# Patient Record
Sex: Female | Born: 1955 | Race: White | Hispanic: No | Marital: Married | Smoking: Former smoker
Health system: Southern US, Community
[De-identification: ages and names within clinical notes are randomized; demographics above are authoritative.]

## PROBLEM LIST (undated history)

## (undated) DIAGNOSIS — R0600 Dyspnea, unspecified: Secondary | ICD-10-CM

## (undated) DIAGNOSIS — M1990 Unspecified osteoarthritis, unspecified site: Secondary | ICD-10-CM

## (undated) DIAGNOSIS — J449 Chronic obstructive pulmonary disease, unspecified: Secondary | ICD-10-CM

## (undated) DIAGNOSIS — C801 Malignant (primary) neoplasm, unspecified: Secondary | ICD-10-CM

## (undated) DIAGNOSIS — J45909 Unspecified asthma, uncomplicated: Secondary | ICD-10-CM

## (undated) DIAGNOSIS — F419 Anxiety disorder, unspecified: Secondary | ICD-10-CM

## (undated) DIAGNOSIS — R918 Other nonspecific abnormal finding of lung field: Secondary | ICD-10-CM

## (undated) DIAGNOSIS — J189 Pneumonia, unspecified organism: Secondary | ICD-10-CM

## (undated) DIAGNOSIS — K635 Polyp of colon: Secondary | ICD-10-CM

## (undated) DIAGNOSIS — H269 Unspecified cataract: Secondary | ICD-10-CM

## (undated) HISTORY — PX: POLYPECTOMY: SHX149

## (undated) HISTORY — PX: COLONOSCOPY: SHX174

## (undated) HISTORY — PX: WISDOM TOOTH EXTRACTION: SHX21

## (undated) HISTORY — DX: Unspecified osteoarthritis, unspecified site: M19.90

## (undated) HISTORY — PX: TOE SURGERY: SHX1073

## (undated) HISTORY — PX: BUNIONECTOMY: SHX129

## (undated) HISTORY — DX: Polyp of colon: K63.5

## (undated) HISTORY — DX: Malignant (primary) neoplasm, unspecified: C80.1

## (undated) HISTORY — DX: Unspecified cataract: H26.9

## (undated) HISTORY — PX: PNEUMONECTOMY: SHX168

---

## 2009-07-22 ENCOUNTER — Ambulatory Visit (HOSPITAL_COMMUNITY): Admission: RE | Admit: 2009-07-22 | Discharge: 2009-07-22 | Payer: Self-pay | Admitting: Sports Medicine

## 2012-05-15 ENCOUNTER — Encounter: Payer: Self-pay | Admitting: Gastroenterology

## 2012-05-29 HISTORY — PX: OTHER SURGICAL HISTORY: SHX169

## 2012-06-12 ENCOUNTER — Ambulatory Visit (INDEPENDENT_AMBULATORY_CARE_PROVIDER_SITE_OTHER): Payer: Self-pay | Admitting: General Surgery

## 2012-06-19 ENCOUNTER — Encounter (INDEPENDENT_AMBULATORY_CARE_PROVIDER_SITE_OTHER): Payer: Self-pay | Admitting: General Surgery

## 2012-06-19 ENCOUNTER — Ambulatory Visit (INDEPENDENT_AMBULATORY_CARE_PROVIDER_SITE_OTHER): Payer: Commercial Managed Care - PPO | Admitting: General Surgery

## 2012-06-19 VITALS — BP 120/82 | HR 90 | Temp 98.8°F | Resp 12 | Ht 63.5 in | Wt 155.0 lb

## 2012-06-19 DIAGNOSIS — K629 Disease of anus and rectum, unspecified: Secondary | ICD-10-CM

## 2012-06-19 DIAGNOSIS — K6289 Other specified diseases of anus and rectum: Secondary | ICD-10-CM

## 2012-06-19 NOTE — Patient Instructions (Signed)
We will schedule a date to have the area removed.  This can be done and an outpatient procedure.

## 2012-06-19 NOTE — Progress Notes (Signed)
Chief Complaint  Patient presents with  . Rectal Problems    ? nodule    HISTORY: [name] is a 57 y.o. female who presents to the office with rectal nodule seen on retroflexion during recent colonoscopy.  Other symptoms include nothing.   Her bowel habits are regular and her bowel movements are mostly soft.  Her fiber intake is moderate.  Her colonoscopy was on 3/18 and showed this anal nodule and a transverse colon polyp that was resected.    No past medical history on file.    Past Surgical History  Procedure Laterality Date  . Toe surgery Right 20 years ago    right small toe        Current Outpatient Prescriptions  Medication Sig Dispense Refill  . fish oil-omega-3 fatty acids 1000 MG capsule Take 1 g by mouth daily.      Marland Kitchen GLUCOSAMINE CHONDROITIN COMPLX PO Take by mouth daily.      . Multiple Vitamin (MULTIVITAMIN) tablet Take 1 tablet by mouth daily.      . norethindrone-ethinyl estradiol (FEMHRT LOW DOSE) 0.5-2.5 MG-MCG per tablet Take 1 tablet by mouth daily.       No current facility-administered medications for this visit.      No Known Allergies    No family history on file.  History   Social History  . Marital Status: Married    Spouse Name: N/A    Number of Children: N/A  . Years of Education: N/A   Social History Main Topics  . Smoking status: Former Smoker    Quit date: 02/28/1994  . Smokeless tobacco: None  . Alcohol Use: 2.5 - 5 oz/week    5-10 drink(s) per week  . Drug Use: No  . Sexually Active: None   Other Topics Concern  . None   Social History Narrative  . None      REVIEW OF SYSTEMS - PERTINENT POSITIVES ONLY: Review of Systems - General ROS: negative for - chills, fever or weight loss Hematological and Lymphatic ROS: negative for - bleeding problems, blood clots or bruising Respiratory ROS: no cough, shortness of breath, or wheezing Cardiovascular ROS: no chest pain or dyspnea on exertion Gastrointestinal ROS: no abdominal  pain, change in bowel habits, or black or bloody stools Genito-Urinary ROS: no dysuria, trouble voiding, or hematuria  EXAM: Filed Vitals:   06/19/12 1031  BP: 120/82  Pulse: 90  Temp: 98.8 F (37.1 C)  Resp: 12    General appearance: alert and cooperative Resp: clear to auscultation bilaterally Cardio: regular rate and rhythm GI: soft, non-tender; bowel sounds normal; no masses,  no organomegaly   Procedure: Anoscopy Surgeon: [name] Diagnosis: anal nodule  Assistant: [name] After the risks and benefits were explained, verbal consent was obtained for above procedure  Anesthesia: none Findings: pedunculated, mobile anal mass in posterior canal.      ASSESSMENT AND PLAN: [name] is a 57 y.o. F with an anal nodule seen on retroflexion recently during colonoscopy.  On exam this is a mobile anal mass.  Given her lack of history of constipation or fissure, I think this should be removed and biopsied.  We will schedule this electively.  The risks of surgery were explained, which are mainly bleeding and anal pain.      Emma Panda, MD Colon and Rectal Surgery / General Surgery [facility]      Visit Diagnoses: 1. Anal lesion     Primary Care  Physician: Malena Peer, MD

## 2012-07-12 DIAGNOSIS — K620 Anal polyp: Secondary | ICD-10-CM

## 2012-07-12 DIAGNOSIS — K621 Rectal polyp: Secondary | ICD-10-CM

## 2012-07-17 ENCOUNTER — Encounter (INDEPENDENT_AMBULATORY_CARE_PROVIDER_SITE_OTHER): Payer: Self-pay

## 2012-07-30 ENCOUNTER — Encounter (INDEPENDENT_AMBULATORY_CARE_PROVIDER_SITE_OTHER): Payer: Self-pay | Admitting: General Surgery

## 2012-07-30 ENCOUNTER — Ambulatory Visit (INDEPENDENT_AMBULATORY_CARE_PROVIDER_SITE_OTHER): Payer: Commercial Managed Care - PPO | Admitting: General Surgery

## 2012-07-30 VITALS — BP 128/72 | HR 72 | Temp 97.5°F | Resp 18 | Ht 63.5 in | Wt 157.2 lb

## 2012-07-30 DIAGNOSIS — Z9889 Other specified postprocedural states: Secondary | ICD-10-CM

## 2012-07-30 NOTE — Progress Notes (Signed)
[name] is a 57 y.o. female who is status post an anal EUA on 5/15.  She is doing well.  She had some moderate pain and bleeding after her 1st BM but this has resolved.  She denies any pain or bleeding now.    Objective: Filed Vitals:   07/30/12 0930  BP: 128/72  Pulse: 72  Temp: 97.5 F (36.4 C)  Resp: 18    General appearance: alert and cooperative GI: soft  Assessment: s/p  There are no active problems to display for this patient.   Plan: [name] is doing well after surgery.  Her pathology was benign.  She can f/u with me as needed[place] [id number] [facility], [place] [phone]   07/30/2012 9:42 AM

## 2012-07-30 NOTE — Patient Instructions (Signed)
Return to office as needed.

## 2012-11-27 ENCOUNTER — Encounter (INDEPENDENT_AMBULATORY_CARE_PROVIDER_SITE_OTHER): Payer: Self-pay

## 2012-12-17 DIAGNOSIS — Z7189 Other specified counseling: Secondary | ICD-10-CM | POA: Insufficient documentation

## 2013-07-18 ENCOUNTER — Ambulatory Visit (INDEPENDENT_AMBULATORY_CARE_PROVIDER_SITE_OTHER): Payer: Commercial Managed Care - PPO | Admitting: Physician Assistant

## 2013-07-18 VITALS — BP 121/70 | HR 90 | Temp 98.1°F | Resp 16 | Ht 63.0 in | Wt 155.4 lb

## 2013-07-18 DIAGNOSIS — L739 Follicular disorder, unspecified: Secondary | ICD-10-CM

## 2013-07-18 DIAGNOSIS — L678 Other hair color and hair shaft abnormalities: Secondary | ICD-10-CM

## 2013-07-18 DIAGNOSIS — L738 Other specified follicular disorders: Secondary | ICD-10-CM

## 2013-07-18 DIAGNOSIS — L282 Other prurigo: Secondary | ICD-10-CM

## 2013-07-18 MED ORDER — DOXYCYCLINE HYCLATE 100 MG PO CAPS: 100.0000 mg | ORAL_CAPSULE | Freq: Two times a day (BID) | ORAL | Status: DC

## 2013-07-18 NOTE — Progress Notes (Signed)
   Subjective:    Patient ID: [name], female    DOB: 05-03-1955, 58 y.o.   MRN: [id number]  HPI 58 year old female presents for evaluation of pruritic rash on bilateral legs x 3 weeks.  States she first noticed it after going to the beach - admits she dangled her feet in the pool but did not get in the water because it was too cold.  She then noticed the rash after this. Also does go hiking almost every weekend but no known hx of poison ivy.  Describes the rash as intensely pruritic but does not seem to be spreading. Over the past 3 weeks, it has neither improved or worsened.    Denies fever, chills, nausea, vomiting, vesicles, blisters or drainage.   She has been using several OTC creams for this including hydrocortisone, calamine, and tree oil - none seem to be helping.   Patient is otherwise doing well with no other concerns today.     Review of Systems  Constitutional: Negative for fever and chills.  Musculoskeletal: Negative for arthralgias.  Skin: Positive for color change and wound.  Neurological: Negative for dizziness and headaches.       Objective:   Physical Exam  Constitutional: She is oriented to person, place, and time. She appears well-developed and well-nourished.  HENT:  Head: Normocephalic and atraumatic.  Right Ear: External ear normal.  Left Ear: External ear normal.  Eyes: Conjunctivae are normal.  Neck: Normal range of motion.  Cardiovascular: Normal rate.   Pulmonary/Chest: Effort normal.  Neurological: She is alert and oriented to person, place, and time.  Skin:  Bilateral lower legs have multiple erythematous papules involving hair follicles.  Also has several honey crusted lesion with scabbing. No warmth, swelling, pain, or drainage.   Psychiatric: She has a normal mood and affect. Her behavior is normal. Judgment and thought content normal.          Assessment & Plan:  Folliculitis - Plan: doxycycline (VIBRAMYCIN) 100 MG  capsule  Pruritic rash  Will treat with doxycycline 100 mg bid x 10 days Ok to continue OTC hydrocortisone cream twice daily as needed for pruritis Recommend Benadryl 25-50 mg at bedtime and Zyrtec daily in the morning.

## 2013-07-18 NOTE — Patient Instructions (Signed)
Folliculitis  Folliculitis is redness, soreness, and swelling (inflammation) of the hair follicles. This condition can occur anywhere on the body. People with weakened immune systems, diabetes, or obesity have a greater risk of getting folliculitis. CAUSES  Bacterial infection. This is the most common cause.  Fungal infection.  Viral infection.  Contact with certain chemicals, especially oils and tars. Long-term folliculitis can result from bacteria that live in the nostrils. The bacteria may trigger multiple outbreaks of folliculitis over time. SYMPTOMS Folliculitis most commonly occurs on the scalp, thighs, legs, back, buttocks, and areas where hair is shaved frequently. An early sign of folliculitis is a small, white or yellow, pus-filled, itchy lesion (pustule). These lesions appear on a red, inflamed follicle. They are usually less than 0.2 inches (5 mm) wide. When there is an infection of the follicle that goes deeper, it becomes a boil or furuncle. A group of closely packed boils creates a larger lesion (carbuncle). Carbuncles tend to occur in hairy, sweaty areas of the body. DIAGNOSIS  Your caregiver can usually tell what is wrong by doing a physical exam. A sample may be taken from one of the lesions and tested in a lab. This can help determine what is causing your folliculitis. TREATMENT  Treatment may include:  Applying warm compresses to the affected areas.  Taking antibiotic medicines orally or applying them to the skin.  Draining the lesions if they contain a large amount of pus or fluid.  Laser hair removal for cases of long-lasting folliculitis. This helps to prevent regrowth of the hair. HOME CARE INSTRUCTIONS  Apply warm compresses to the affected areas as directed by your caregiver.  If antibiotics are prescribed, take them as directed. Finish them even if you start to feel better.  You may take over-the-counter medicines to relieve itching.  Do not shave  irritated skin.  Follow up with your caregiver as directed. SEEK IMMEDIATE MEDICAL CARE IF:   You have increasing redness, swelling, or pain in the affected area.  You have a fever. MAKE SURE YOU:  Understand these instructions.  Will watch your condition.  Will get help right away if you are not doing well or get worse. Document Released: 04/25/2001 Document Revised: 08/16/2011 Document Reviewed: 05/17/2011 [facility] Patient Information 2014 [place], [place].

## 2013-08-01 ENCOUNTER — Telehealth: Payer: Self-pay

## 2013-08-01 NOTE — Telephone Encounter (Signed)
I think it is reasonable to give it a bit more time and continue hydrocortisone cream. If it does not resolve over the next week then I recommend recheck

## 2013-08-01 NOTE — Telephone Encounter (Signed)
Pt saw [name] on the 21 for a skin rash, she has completed the medication and the rash is better but not gone, would like to know if this is ok, or if she needs to come in

## 2013-08-02 NOTE — Telephone Encounter (Signed)
Advised pt- she will be coming back in if the rash does not clear up.

## 2015-02-06 ENCOUNTER — Ambulatory Visit (INDEPENDENT_AMBULATORY_CARE_PROVIDER_SITE_OTHER): Payer: Commercial Managed Care - PPO

## 2015-02-06 ENCOUNTER — Ambulatory Visit (INDEPENDENT_AMBULATORY_CARE_PROVIDER_SITE_OTHER): Payer: Commercial Managed Care - PPO | Admitting: Podiatry

## 2015-02-06 ENCOUNTER — Encounter: Payer: Self-pay | Admitting: Podiatry

## 2015-02-06 VITALS — BP 129/77 | HR 74 | Resp 16

## 2015-02-06 DIAGNOSIS — L600 Ingrowing nail: Secondary | ICD-10-CM | POA: Diagnosis not present

## 2015-02-06 DIAGNOSIS — M2040 Other hammer toe(s) (acquired), unspecified foot: Secondary | ICD-10-CM | POA: Diagnosis not present

## 2015-02-06 DIAGNOSIS — M21619 Bunion of unspecified foot: Secondary | ICD-10-CM | POA: Diagnosis not present

## 2015-02-06 DIAGNOSIS — M779 Enthesopathy, unspecified: Secondary | ICD-10-CM

## 2015-02-06 MED ORDER — TRIAMCINOLONE ACETONIDE 10 MG/ML IJ SUSP
10.0000 mg | Freq: Once | INTRAMUSCULAR | Status: AC
  Administered 2015-02-06: 10 mg

## 2015-02-06 NOTE — Progress Notes (Signed)
   Subjective:    Patient ID: [name], female    DOB: [birth year], 59 y.o.   MRN: [id number]  HPI Pt presents with bilateral bunions right over left. Painful interdigital corns bilateral   Review of Systems  All other systems reviewed and are negative.      Objective:   Physical Exam        Assessment & Plan:

## 2015-02-06 NOTE — Patient Instructions (Signed)

## 2015-02-08 NOTE — Progress Notes (Signed)
Subjective:     Patient ID: [name], female   DOB: 02-13-1956, 59 y.o.   MRN: [id number]  HPI patient presents with painful corns between the toes structural bunions of both feet that can become tender and ingrown toenail deformity right hallux medial border that's painful and she cannot cut   Review of Systems  All other systems reviewed and are negative.      Objective:   Physical Exam  Constitutional: She is oriented to person, place, and time.  Cardiovascular: Intact distal pulses.   Musculoskeletal: Normal range of motion.  Neurological: She is oriented to person, place, and time.  Skin: Skin is warm.  Nursing note and vitals reviewed.  Neurovascular status was found to be intact with muscle strength adequate range of motion within normal limits. Is noted to have hyperostosis medial aspect first metatarsal head of both feet along with incurvated medial border right hallux and interphalangeal joint keratotic painful lesion second toe left foot with fluid buildup and keratotic lesions between the adjacent third and fourth digits bilateral. Good digital perfusion is noted well oriented 3    Assessment:     Structural deformities of both feet with ingrown toenail deformity right hallux and also interphalangeal joint capsulitis second toe left along with hammertoe deformity    Plan:     H&P and all conditions reviewed with patient. X-rays reviewed with patient and today were to focus on the acute issues have recommended ingrown toenail correction explaining risk and she wants the procedure along with interphalangeal joint injection left. Today I infiltrated the right hallux 60 Milligan times like Marcaine mixture remove the medial border exposed matrix and applied phenol 3 applications 30 seconds followed by alcohol lavage and sterile dressing. Gave instructions on soaks and did interphalangeal joint left second toe and reappoint and discussed ultimate structural correction of  feet depending on response

## 2015-02-10 ENCOUNTER — Telehealth: Payer: Self-pay | Admitting: *Deleted

## 2015-02-10 NOTE — Telephone Encounter (Signed)
Called patient at [phone] (Home #) to check to see how they were doing from their ingrown toenail procedure that was performed on Friday, February 06, 2015. Pt stated, "Toe feels fine. Soaking toes with some relief."

## 2015-03-18 ENCOUNTER — Ambulatory Visit (INDEPENDENT_AMBULATORY_CARE_PROVIDER_SITE_OTHER): Payer: Commercial Managed Care - PPO | Admitting: Obstetrics and Gynecology

## 2015-03-18 ENCOUNTER — Encounter: Payer: Self-pay | Admitting: Obstetrics and Gynecology

## 2015-03-18 VITALS — BP 120/72 | HR 84 | Resp 14 | Ht 63.0 in | Wt 140.8 lb

## 2015-03-18 DIAGNOSIS — Z780 Asymptomatic menopausal state: Secondary | ICD-10-CM | POA: Diagnosis not present

## 2015-03-18 DIAGNOSIS — N852 Hypertrophy of uterus: Secondary | ICD-10-CM

## 2015-03-18 DIAGNOSIS — D259 Leiomyoma of uterus, unspecified: Secondary | ICD-10-CM | POA: Diagnosis not present

## 2015-03-18 NOTE — Progress Notes (Signed)
Patient ID: [name], female   DOB: 06-08-1955, 60 y.o.   MRN: [id number] 60 y.o. G80P0010 Married female here for evaluation of possible fibroid uterus.  Went in for routine exam. Primary GYN noted a large fibroid on office ultrasound - 12 - 14 weeks size. Had prior fibroid but this seems to have grown.   Patient also complains of urinary urgency.   Has back pain but this is chronic.  Increased with in the last year.  Has constipation.  No bleeding.  Is on HRT now for about 5 years.  Came off of OCPs and then later started HRT for hot flashes.   Going to [place] for Easter in April.  Referring provider:   Hinton Rao, MD  Patient's last menstrual period was 02/28/2009 (approximate).          Sexually active: Yes.   female The current method of family planning is post menopausal status.    Exercising: Yes.    hike on weekend, walk daily and gym 3x/week. Smoker:  Former--quit 1996  Health Maintenance: Pap:  02-16-15 Neg History of abnormal Pap:  no MMG:  08-12-14 Density Cat.B/Neg/BiRads1:[facility] Colonoscopy:  2014 polyps with Dr. Lacey Jensen also had anal nodule which was removed by Dr. Leighton Ruff with [facility] and was benign.  Next colonoscopy due 04/2015. BMD:   n/a  Result  n/a TDaP:  2010 Screening Labs:  Hb today: PCP, Urine today: not done   reports that she quit smoking about 21 years ago. She does not have any smokeless tobacco history on file. She reports that she drinks about 4.2 - 6.0 oz of alcohol per week. She reports that she does not use illicit drugs.  Past Medical History  Diagnosis Date  . Osteoarthritis     Past Surgical History  Procedure Laterality Date  . Toe surgery Right 20 years ago    right small toe  . Rectal nodule removal  05/2012    --benign with Dr. Elmo Putt Thomas([facility])    Current Outpatient Prescriptions  Medication Sig Dispense Refill  . calcium-vitamin D (CALCIUM 500/D) 500-200 MG-UNIT tablet Take 1 tablet by mouth 2 (two)  times daily.    . fish oil-omega-3 fatty acids 1000 MG capsule Take 1 g by mouth daily.    Marland Kitchen GLUCOSAMINE CHONDROITIN COMPLX PO Take by mouth daily.    . Multiple Vitamin (MULTIVITAMIN) tablet Take 1 tablet by mouth daily.    . norethindrone-ethinyl estradiol (FEMHRT LOW DOSE) 0.5-2.5 MG-MCG per tablet Take 1 tablet by mouth daily.     No current facility-administered medications for this visit.    Family History  Problem Relation Age of Onset  . Cancer Maternal Grandmother 2    Colon Ca--Dec age 52 from age related problems  . Asthma Maternal Grandmother   . Hypertension Mother   . Thyroid disease Mother     parathyroid Dz  . Hyperlipidemia Brother     ROS:  Pertinent items are noted in HPI.  Otherwise, a comprehensive ROS was negative.  Exam:   BP 120/72 mmHg  Pulse 84  Resp 14  Ht '5\' 3"'$  (1.6 m)  Wt 140 lb 12.8 oz (63.866 kg)  BMI 24.95 kg/m2  LMP 02/28/2009 (Approximate)    General appearance: alert, cooperative and appears stated age Head: Normocephalic, without obvious abnormality, atraumatic Neck: no adenopathy, supple, symmetrical, trachea midline and thyroid normal to inspection and palpation Lungs: clear to auscultation bilaterally   Heart: regular rate and rhythm Abdomen: soft, non-tender;  bowel sounds normal; pelvic mass palpable to about 5 cm below umbilicus, nontender. Extremities: extremities normal, atraumatic, no cyanosis or edema Skin: Skin color, texture, turgor normal. No rashes or lesions Lymph nodes: Cervical, supraclavicular, and axillary nodes normal. No abnormal inguinal nodes palpated Neurologic: Grossly normal  Pelvic: External genitalia:  no lesions              Urethra:  normal appearing urethra with no masses, tenderness or lesions              Bartholins and Skenes: normal                 Vagina: normal appearing vagina with normal color and discharge, no lesions              Cervix: no lesions              Pap taken: No. Bimanual Exam:   Uterus:  enlarged,  15 - 16 week size.  Very broad.  Extends more superiorly on right than left fundus.  Some mobility.  weeks size              Adnexa: no mass, fullness, tenderness and ovaries not palpated separately from uterus.               Rectovaginal: Yes.  .  Confirms.              Anus:  normal sphincter tone, no lesions  Chaperone was present for exam.  Assessment:     Postmenopausal patient.  On HRT. Uterus enlarging.  Fibroid by prior ultrasound at her primary GYN office.   Plan:  Discussion of fibroids - natural history, symptoms, generally benign nature and risk of sarcoma  Return for ultrasound.   ? Need for MRI? Preliminary discussion of total abdominal hysterectomy with BSO.  I do not recommend vaginal or laparoscopic approach in order to avoid morcellation.  ACOG handouts on fibroids and hysterectomy.      __45_____ minutes face to face time of which over 50% was spent in counseling.    After visit summary provided.

## 2015-03-19 ENCOUNTER — Telehealth: Payer: Self-pay | Admitting: Obstetrics and Gynecology

## 2015-03-19 NOTE — Telephone Encounter (Signed)
Spoke with pt regarding benefit for ultrasound. Patient understood and agreeable. Patient ready to schedule. Patient scheduled 03/26/15 with Dr Quincy Simmonds. Pt aware of arrival date and time. Pt aware of 72 hours cancellation policy with $825 fee. No further questions. Ok to close

## 2015-03-26 ENCOUNTER — Encounter: Payer: Self-pay | Admitting: Obstetrics and Gynecology

## 2015-03-26 ENCOUNTER — Ambulatory Visit (INDEPENDENT_AMBULATORY_CARE_PROVIDER_SITE_OTHER): Payer: Commercial Managed Care - PPO | Admitting: Obstetrics and Gynecology

## 2015-03-26 ENCOUNTER — Ambulatory Visit (INDEPENDENT_AMBULATORY_CARE_PROVIDER_SITE_OTHER): Payer: Commercial Managed Care - PPO

## 2015-03-26 VITALS — BP 142/82 | HR 100 | Ht 63.0 in | Wt 139.0 lb

## 2015-03-26 DIAGNOSIS — R1900 Intra-abdominal and pelvic swelling, mass and lump, unspecified site: Secondary | ICD-10-CM

## 2015-03-26 DIAGNOSIS — N852 Hypertrophy of uterus: Secondary | ICD-10-CM

## 2015-03-26 DIAGNOSIS — D259 Leiomyoma of uterus, unspecified: Secondary | ICD-10-CM

## 2015-03-26 LAB — CEA: CEA: 2.5 ng/mL (ref 0.0–5.0)

## 2015-03-26 NOTE — Patient Instructions (Signed)
Ovarian Cyst An ovarian cyst is a fluid-filled sac that forms on an ovary. The ovaries are small organs that produce eggs in women. Various types of cysts can form on the ovaries. Most are not cancerous. Many do not cause problems, and they often go away on their own. Some may cause symptoms and require treatment. Common types of ovarian cysts include:  Functional cysts--These cysts may occur every month during the menstrual cycle. This is normal. The cysts usually go away with the next menstrual cycle if the woman does not get pregnant. Usually, there are no symptoms with a functional cyst.  Endometrioma cysts--These cysts form from the tissue that lines the uterus. They are also called "chocolate cysts" because they become filled with blood that turns brown. This type of cyst can cause pain in the lower abdomen during intercourse and with your menstrual period.  Cystadenoma cysts--This type develops from the cells on the outside of the ovary. These cysts can get very big and cause lower abdomen pain and pain with intercourse. This type of cyst can twist on itself, cut off its blood supply, and cause severe pain. It can also easily rupture and cause a lot of pain.  Dermoid cysts--This type of cyst is sometimes found in both ovaries. These cysts may contain different kinds of body tissue, such as skin, teeth, hair, or cartilage. They usually do not cause symptoms unless they get very big.  Theca lutein cysts--These cysts occur when too much of a certain hormone (human chorionic gonadotropin) is produced and overstimulates the ovaries to produce an egg. This is most common after procedures used to assist with the conception of a baby (in vitro fertilization). CAUSES   Fertility drugs can cause a condition in which multiple large cysts are formed on the ovaries. This is called ovarian hyperstimulation syndrome.  A condition called polycystic ovary syndrome can cause hormonal imbalances that can lead to  nonfunctional ovarian cysts. SIGNS AND SYMPTOMS  Many ovarian cysts do not cause symptoms. If symptoms are present, they may include:  Pelvic pain or pressure.  Pain in the lower abdomen.  Pain during sexual intercourse.  Increasing girth (swelling) of the abdomen.  Abnormal menstrual periods.  Increasing pain with menstrual periods.  Stopping having menstrual periods without being pregnant. DIAGNOSIS  These cysts are commonly found during a routine or annual pelvic exam. Tests may be ordered to find out more about the cyst. These tests may include:  Ultrasound.  X-ray of the pelvis.  CT scan.  MRI.  Blood tests. TREATMENT  Many ovarian cysts go away on their own without treatment. Your health care provider may want to check your cyst regularly for 2-3 months to see if it changes. For women in menopause, it is particularly important to monitor a cyst closely because of the higher rate of ovarian cancer in menopausal women. When treatment is needed, it may include any of the following:  A procedure to drain the cyst (aspiration). This may be done using a long needle and ultrasound. It can also be done through a laparoscopic procedure. This involves using a thin, lighted tube with a tiny camera on the end (laparoscope) inserted through a small incision.  Surgery to remove the whole cyst. This may be done using laparoscopic surgery or an open surgery involving a larger incision in the lower abdomen.  Hormone treatment or birth control pills. These methods are sometimes used to help dissolve a cyst. HOME CARE INSTRUCTIONS   Only take over-the-counter   or prescription medicines as directed by your health care provider.  Follow up with your health care provider as directed.  Get regular pelvic exams and Pap tests. SEEK MEDICAL CARE IF:   Your periods are late, irregular, or painful, or they stop.  Your pelvic pain or abdominal pain does not go away.  Your abdomen becomes  larger or swollen.  You have pressure on your bladder or trouble emptying your bladder completely.  You have pain during sexual intercourse.  You have feelings of fullness, pressure, or discomfort in your stomach.  You lose weight for no apparent reason.  You feel generally ill.  You become constipated.  You lose your appetite.  You develop acne.  You have an increase in body and facial hair.  You are gaining weight, without changing your exercise and eating habits.  You think you are pregnant. SEEK IMMEDIATE MEDICAL CARE IF:   You have increasing abdominal pain.  You feel sick to your stomach (nauseous), and you throw up (vomit).  You develop a fever that comes on suddenly.  You have abdominal pain during a bowel movement.  Your menstrual periods become heavier than usual. MAKE SURE YOU:  Understand these instructions.  Will watch your condition.  Will get help right away if you are not doing well or get worse.   This information is not intended to replace advice given to you by your health care provider. Make sure you discuss any questions you have with your health care provider.   Document Released: 02/14/2005 Document Revised: 02/19/2013 Document Reviewed: 10/22/2012 Elsevier Interactive Patient Education 2016 [employer]  

## 2015-03-26 NOTE — Progress Notes (Signed)
Subjective  60 y.o. G5P0000  Caucasian female here for pelvic ultrasound for enlarged uterus.  Patient was referred by her gynecologist for a large uterine fibroid.  Objective  Pelvic ultrasound images and report reviewed with patient.  Uterus - atrophic with 16 mm fibroid. EMS - 1.57 mm. Ovaries - 13 x 10 x 13 cm mixed pelvic mass - right ovarian in nature?  Cystic with solid areas.  Bilateral ovaries not clearly identified. Free fluid - no       Assessment  Postmenopausal female. Large pelvic mass. Possible right adnexal origin.  Small uterine fibroid.  Plan  Discussion of pelvic masses/ovarian cysts - benign and malignant.  Plan for CA125 and CEA now. Will need CT scan of abdomen and pelvis.  Discussed need for surgical excision through laparotomy.  Plan for TAH/BSO with pelvic washings and surgical staging if needed. May need GYN ONC referral.   ___15____ minutes face to face time of which over 50% was spent in counseling.   After visit summary to patient.

## 2015-03-27 ENCOUNTER — Telehealth: Payer: Self-pay | Admitting: Obstetrics and Gynecology

## 2015-03-27 ENCOUNTER — Other Ambulatory Visit: Payer: Self-pay | Admitting: Obstetrics and Gynecology

## 2015-03-27 DIAGNOSIS — R1900 Intra-abdominal and pelvic swelling, mass and lump, unspecified site: Secondary | ICD-10-CM

## 2015-03-27 LAB — CA 125: CA 125: 10 U/mL (ref <35)

## 2015-03-27 NOTE — Telephone Encounter (Signed)
Spoke with patient. Results and message given as seen below from [place]. Patient is agreeable and verbalizes understanding.   Call to [place] abdomen and pelvis with contrast scheduled at [facility] 04/01/2015 at 5:30 pm with a 5:10 pm arrival. The patient will need to pick up her contrast material on Monday 03/30/2015. No solid food 4 hours prior to her appointment. Liquids and medications are okay.

## 2015-03-27 NOTE — Telephone Encounter (Signed)
Spoke with patient. Advised of her appointment date and time for her Ct abdomen and pelvis on 04/01/2015 at 5:30 pm with 5:10 pm arrival at [facility]. Patient is agreeable and will pick up her contrast on 03/30/2015. Placed in imaging hold. Aware she is not to eat any solid foods 4 hours prior to her exam. Liquids and medications are okay.  Routing to provider for final review. Patient agreeable to disposition. Will close encounter.

## 2015-03-27 NOTE — Telephone Encounter (Signed)
    Patient calling for lab results 

## 2015-03-27 NOTE — Telephone Encounter (Signed)
Patient returned your call left a message on our voicemail during lunch hours.

## 2015-03-27 NOTE — Telephone Encounter (Signed)
Patient is calling for recent CA 125 and CEA results from 03/26/2015. Routing to [place] for review and advise.

## 2015-03-27 NOTE — Telephone Encounter (Signed)
Good news! Both are back and are in a normal range.   I am recommending we proceed with scheduling a CT scan of abdomen and pelvis with contrast as we planned. I will place the order now.  Hopefully we can get this done next week.

## 2015-04-01 ENCOUNTER — Ambulatory Visit
Admission: RE | Admit: 2015-04-01 | Discharge: 2015-04-01 | Disposition: A | Discharge status: Home / Self Care | Payer: Commercial Managed Care - PPO | Source: Ambulatory Visit | Attending: Obstetrics and Gynecology | Admitting: Obstetrics and Gynecology

## 2015-04-01 DIAGNOSIS — R1900 Intra-abdominal and pelvic swelling, mass and lump, unspecified site: Secondary | ICD-10-CM

## 2015-04-01 MED ORDER — IOPAMIDOL (ISOVUE-300) INJECTION 61%
100.0000 mL | Freq: Once | INTRAVENOUS | Status: AC | PRN
  Administered 2015-04-01: 100 mL via INTRAVENOUS

## 2015-04-02 ENCOUNTER — Telehealth: Payer: Self-pay | Admitting: Obstetrics and Gynecology

## 2015-04-02 NOTE — Telephone Encounter (Signed)
Phone call to patient on cell phone to discuss CT results. No answer. LM for patient to return my call.   She will need to see GYN ONC for consultation and determination if they will do the patient's surgery or I will do the patient's surgery.

## 2015-04-02 NOTE — Telephone Encounter (Signed)
Call to patient with appointment with Dr. Denman George:    The appointment is scheduled for 04/06/15 at 1200 with Dr. Denman George arrive at 1130.   Instructions regarding arrive and appointment location given. Patient will call back with any questions or concerns.   Routing to provider for final review. Patient agreeable to disposition. Will close encounter.

## 2015-04-02 NOTE — Telephone Encounter (Signed)
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   I am recommending GYN ONC consultation to determine who best will proceed with TAH/BSO - GYN ONC or continuation with my care.   I will have our office set up this consultation with Dr. Everitt Amber or [facility] [place] staff at the [facility].   Patient indicates understanding of this plan and agrees with this appointment.

## 2015-04-02 NOTE — Telephone Encounter (Addendum)
Tonya with [facility] gave report on patient's CT Abdomen and Pelvis With Contrast that was done by Dr. Suzy Bouchard see below. Full report in EPIC under imaging DOS 04/01/2015.  IMPRESSION: 1. 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 Lesion has mature elements including fat, bone, and teeth. Recommend GYN surgical consultation. 2. Mass effect upon the distal RIGHT ureter with mild hydronephrosis and hydroureter. 3. Moderate stool retention may also be mass effect. 4. Single lesion within the RIGHT hepatic lobe measuring 6 mm has fat density likely represents a benign lipoma. Recommend correlation with dermoid pathology assuming resection. These results will be called to the ordering clinician or representative by the Radiologist Assistant, and communication documented in the PACS or zVision Dashboard.   Electronically Signed  By: Suzy Bouchard M.D.  On: 04/01/2015 18:47

## 2015-04-06 ENCOUNTER — Ambulatory Visit: Payer: Commercial Managed Care - PPO | Attending: Gynecologic Oncology | Admitting: Gynecologic Oncology

## 2015-04-06 ENCOUNTER — Encounter: Payer: Self-pay | Admitting: Gynecologic Oncology

## 2015-04-06 VITALS — BP 155/81 | HR 84 | Temp 98.1°F | Resp 18 | Ht 63.0 in | Wt 138.4 lb

## 2015-04-06 DIAGNOSIS — N839 Noninflammatory disorder of ovary, fallopian tube and broad ligament, unspecified: Secondary | ICD-10-CM | POA: Diagnosis present

## 2015-04-06 DIAGNOSIS — D259 Leiomyoma of uterus, unspecified: Secondary | ICD-10-CM | POA: Insufficient documentation

## 2015-04-06 DIAGNOSIS — Z87891 Personal history of nicotine dependence: Secondary | ICD-10-CM | POA: Insufficient documentation

## 2015-04-06 DIAGNOSIS — M1990 Unspecified osteoarthritis, unspecified site: Secondary | ICD-10-CM | POA: Diagnosis not present

## 2015-04-06 DIAGNOSIS — N838 Other noninflammatory disorders of ovary, fallopian tube and broad ligament: Secondary | ICD-10-CM

## 2015-04-06 DIAGNOSIS — R1900 Intra-abdominal and pelvic swelling, mass and lump, unspecified site: Secondary | ICD-10-CM | POA: Diagnosis not present

## 2015-04-06 DIAGNOSIS — N854 Malposition of uterus: Secondary | ICD-10-CM | POA: Insufficient documentation

## 2015-04-06 DIAGNOSIS — D369 Benign neoplasm, unspecified site: Secondary | ICD-10-CM

## 2015-04-06 NOTE — Patient Instructions (Signed)
Follow up with Dr. Quincy Simmonds.  Please call our office for any questions or concerns.

## 2015-04-06 NOTE — Progress Notes (Signed)
Consult Note: Gyn-Onc  Consult was requested by Dr. Quincy Simmonds for the evaluation of [name] 60 y.o. female  CC:  Chief Complaint  Patient presents with  . Ovarian Mass    New Consultation    Assessment/Plan:  Ms. [name]  is a 60 y.o.  year old with a 15cm ovarian mass that appears consistent in appearance with a benign dermoid cyst. Of note, her CA 125 is also normal.  I personally reviewed her images, including her CT scan from 03/31/14. I believe that this mass is most likely benign. I believe it is reasonable for Dr Quincy Simmonds to approach this surgically as I have a low suspicion that the patient will require staging procedures, or debulking. I discussed that BSO with frozen section would be the first reasonable approach. If benign on frozen, hysterectomy could be reserved for patient or provider preference. The patient discussed that she would prefer to avoid hysterectomy if not necessary as part of removal of the mass.  I would advocate for an open approach and intact removal of the mass with obtaining washings.  I have discussed this plan with Dr Quincy Simmonds who is in agreement and who will facilitate a surgical date with the patient.    HPI: [name] is a 60 year old G0 who is seen in consultation at the request of Dr. Quincy Simmonds for a large pelvic mass.  The patient has symptoms of frequent urinary micturition. She had a pelvic examination by her primary care provider in December 2016 which confirmed a pelvic mass. She was referred to Dr. Quincy Simmonds who performed a transvaginal and pelvic ultrasound scan on 08/24/2015. This revealed an atrophic anteverted uterus with a 16 mm fundal fibroid and no masses identified in the endometrium which was 1.6 mm thick. The uterus itself measured 4.5 x 3.5 x 1.6 cm. Discrete bilateral ovarian tissue was difficult to identify. However there was a large mixed pelvic mass measuring 13 x 10 x 13 cm with no vascular flow identified and smooth  borders. It contained internal echoes and appeared to be cystic or solid areas. On 3 first 2017 she underwent a CT scan of the abdomen and pelvis. This revealed a large round ovoid fat density mass posterior to the uterus measuring 11.2 x 9.6 cm and 15 cm in craniocaudal dimension. The lesion is almost entirely fat density. There is some heterogeneous calcification within it consistent with calcifications and formed teeth. The uterus was compressed anteriorly. There is no associated lymphadenopathy ascites omental disease or peritoneal carcinomatosis.   Current Meds:  Outpatient Encounter Prescriptions as of 04/06/2015  Medication Sig  . calcium-vitamin D (CALCIUM 500/D) 500-200 MG-UNIT tablet Take 1 tablet by mouth 2 (two) times daily.  . fish oil-omega-3 fatty acids 1000 MG capsule Take 1 g by mouth daily.  Marland Kitchen GLUCOSAMINE CHONDROITIN COMPLX PO Take by mouth daily.  . Multiple Vitamin (MULTIVITAMIN) tablet Take 1 tablet by mouth daily.  . norethindrone-ethinyl estradiol (FEMHRT LOW DOSE) 0.5-2.5 MG-MCG per tablet Take 1 tablet by mouth daily.   No facility-administered encounter medications on file as of 04/06/2015.    Allergy: No Known Allergies  Social Hx:   Social History   Social History  . Marital Status: Married    Spouse Name: N/A  . Number of Children: N/A  . Years of Education: N/A   Occupational History  . Not on file.   Social History Main Topics  . Smoking status: Former Smoker    Quit date: 02/28/1994  .  Smokeless tobacco: Not on file  . Alcohol Use: 4.2 - 6.0 oz/week    7-10 Standard drinks or equivalent per week     Comment: 1-2 glasses of wine per day  . Drug Use: No  . Sexual Activity:    Partners: Male    Birth Control/ Protection: Post-menopausal   Other Topics Concern  . Not on file   Social History Narrative    Past Surgical Hx:  Past Surgical History  Procedure Laterality Date  . Toe surgery Right 20 years ago    right small toe  . Rectal nodule  removal  05/2012    --benign with Dr. Elmo Putt Thomas([facility])    Past Medical Hx:  Past Medical History  Diagnosis Date  . Osteoarthritis     Past Gynecological History:  G0  Patient's last menstrual period was 02/28/2009 (approximate).  Family Hx:  Family History  Problem Relation Age of Onset  . Cancer Maternal Grandmother 9    Colon Ca--Dec age 98 from age related problems  . Asthma Maternal Grandmother   . Hypertension Mother   . Thyroid disease Mother     parathyroid Dz  . Hyperlipidemia Brother     Review of Systems:  Constitutional  Feels well,    ENT Normal appearing ears and nares bilaterally Skin/Breast  No rash, sores, jaundice, itching, dryness Cardiovascular  No chest pain, shortness of breath, or edema  Pulmonary  No cough or wheeze.  Gastro Intestinal  No nausea, vomitting, or diarrhoea. No bright red blood per rectum, no abdominal pain, change in bowel movement, or constipation.  Genito Urinary  No bleeding, urgency, dysuria, + frequency Musculo Skeletal  No myalgia, arthralgia, joint swelling or pain  Neurologic  No weakness, numbness, change in gait,  Psychology  No depression, anxiety, insomnia.   Vitals:  Blood pressure 155/81, pulse 84, temperature 98.1 F (36.7 C), temperature source Oral, resp. rate 18, height '5\' 3"'$  (1.6 m), weight 138 lb 6.4 oz (62.778 kg), last menstrual period 02/28/2009, SpO2 99 %.  Physical Exam: WD in NAD Neck  Supple NROM, without any enlargements.  Lymph Node Survey No cervical supraclavicular or inguinal adenopathy Cardiovascular  Pulse normal rate, regularity and rhythm. S1 and S2 normal.  Lungs  Clear to auscultation bilateraly, without wheezes/crackles/rhonchi. Good air movement.  Skin  No rash/lesions/breakdown  Psychiatry  Alert and oriented to person, place, and time  Abdomen  Normoactive bowel sounds, abdomen soft, non-tender and thin without evidence of hernia. There is a palpable mass in the lower  abdomen that is cystic and mobile and nontender Back No CVA tenderness Genito Urinary  Vulva/vagina: Normal external female genitalia.  No lesions. No discharge or bleeding.  Bladder/urethra:  No lesions or masses, well supported bladder  Vagina: normal  Cervix: Normal appearing, no lesions.  Uterus:  Small, mobile, no parametrial involvement or nodularity. Displaced anteriorally  Adnexa: Large cystic mobile mass appreciated behind uterus. Not well appreciated on RV exam. No nodularity.   Extremities  No bilateral cyanosis, clubbing or edema.   Donaciano Eva, MD  04/06/2015, 4:33 PM

## 2015-04-08 ENCOUNTER — Telehealth: Payer: Self-pay | Admitting: Obstetrics and Gynecology

## 2015-04-08 NOTE — Telephone Encounter (Signed)
Return call to [place]. Please call [phone]

## 2015-04-08 NOTE — Telephone Encounter (Signed)
Patient was referred to Dr. Denman George by Dr. Quincy Simmonds. Dr. Denman George is recommending Dr. Quincy Simmonds to do surgery on her. Patient calling to check on this and see if Dr. Quincy Simmonds has had any chance to talk to Dr. Denman George. I notified patient that Dr. Quincy Simmonds is out of the office today but hopefully she will be in tomorrow to look at this. Patient is aware. Best # to reach: [phone] (c) & (w) [phone]

## 2015-04-08 NOTE — Telephone Encounter (Signed)
Return call to patient. Left message to call back. 

## 2015-04-09 NOTE — Telephone Encounter (Signed)
Spoke with patient. She is advised that 04/28/15 is surgical day with Dr. Quincy Simmonds and patient is agreeable to this date.  Advised she will receive a call from our office insurance representative and from Lamont Snowball, RN who schedules surgical procedures.  Patient agreeable. She will wait to hear back from our office.  She is scheduled for office visit with Dr. Quincy Simmonds for consult for surgery or pre-op for 04/13/15 at 0900. Patient is agreeable.

## 2015-04-09 NOTE — Telephone Encounter (Signed)
Thank you for the update!

## 2015-04-13 ENCOUNTER — Encounter: Payer: Self-pay | Admitting: Obstetrics and Gynecology

## 2015-04-13 ENCOUNTER — Ambulatory Visit (INDEPENDENT_AMBULATORY_CARE_PROVIDER_SITE_OTHER): Payer: Commercial Managed Care - PPO | Admitting: Obstetrics and Gynecology

## 2015-04-13 ENCOUNTER — Telehealth: Payer: Self-pay | Admitting: Obstetrics and Gynecology

## 2015-04-13 VITALS — BP 122/70 | HR 76 | Ht 63.0 in | Wt 137.8 lb

## 2015-04-13 DIAGNOSIS — R1900 Intra-abdominal and pelvic swelling, mass and lump, unspecified site: Secondary | ICD-10-CM | POA: Diagnosis not present

## 2015-04-13 NOTE — Progress Notes (Signed)
Patient ID: [name], female   DOB: 1956/01/23, 60 y.o.   MRN: [id number] GYNECOLOGY  VISIT   HPI: 60 y.o.   Married  Caucasian  female   G1P0000 with Patient's last menstrual period was 02/28/2009 (approximate).   here for Surgical Consult.    Wants hysterectomy.   Has a large 15 cm pelvic mass consistent with a dermoid cyst confirmed by pelvic ultrasound and pelvic/abdominal CT scan.  Patient also has a 16 mm fibroid of the uterus. Dr. Denman George has consulted and believes this is a benign ovarian cyst and that she does not need to perform the patient's surgery. CA125 10, CEA 2.5.  GYNECOLOGIC HISTORY: Patient's last menstrual period was 02/28/2009 (approximate). Contraception:Postmenopausal Menopausal hormone therapy: FemHrt 0.5/2-8m/mc Last mammogram: 08-12-14 Density Cat.B/Neg/BiRads1:[facility] Last pap smear: 02-16-15 Neg        OB History    Gravida Para Term Preterm AB TAB SAB Ectopic Multiple Living   1 0 0  0     0         Patient Active Problem List   Diagnosis Date Noted  . Dermoid cyst 04/06/2015    Past Medical History  Diagnosis Date  . Osteoarthritis     Past Surgical History  Procedure Laterality Date  . Toe surgery Right 20 years ago    right small toe  . Rectal nodule removal  05/2012    --benign with Dr. AElmo PuttThomas([facility])    Current Outpatient Prescriptions  Medication Sig Dispense Refill  . calcium-vitamin D (CALCIUM 500/D) 500-200 MG-UNIT tablet Take 1 tablet by mouth 2 (two) times daily.    . fish oil-omega-3 fatty acids 1000 MG capsule Take 1 g by mouth daily.    .Marland KitchenGLUCOSAMINE CHONDROITIN COMPLX PO Take by mouth daily.    . Multiple Vitamin (MULTIVITAMIN) tablet Take 1 tablet by mouth daily.    . norethindrone-ethinyl estradiol (FEMHRT LOW DOSE) 0.5-2.5 MG-MCG per tablet Take 1 tablet by mouth daily.     No current facility-administered medications for this visit.     ALLERGIES: Review of patient's allergies indicates no known  allergies.  Family History  Problem Relation Age of Onset  . Cancer Maternal Grandmother 469   Colon Ca--Dec age 754from age related problems  . Asthma Maternal Grandmother   . Hypertension Mother   . Thyroid disease Mother     parathyroid Dz  . Hyperlipidemia Brother     Social History   Social History  . Marital Status: Married    Spouse Name: N/A  . Number of Children: N/A  . Years of Education: N/A   Occupational History  . Not on file.   Social History Main Topics  . Smoking status: Former Smoker    Quit date: 02/28/1994  . Smokeless tobacco: Not on file  . Alcohol Use: 4.2 - 6.0 oz/week    7-10 Standard drinks or equivalent per week     Comment: 1-2 glasses of wine per day  . Drug Use: No  . Sexual Activity:    Partners: Male    Birth Control/ Protection: Post-menopausal   Other Topics Concern  . Not on file   Social History Narrative    ROS:  Pertinent items are noted in HPI.  PHYSICAL EXAMINATION:    BP 122/70 mmHg  Pulse 76  Ht '5\' 3"'$  (1.6 m)  Wt 137 lb 12.8 oz (62.506 kg)  BMI 24.42 kg/m2  LMP 02/28/2009 (Approximate)    General appearance: alert, cooperative and  appears stated age Head: Normocephalic, without obvious abnormality, atraumatic Neck: no adenopathy, supple, symmetrical, trachea midline and thyroid normal to inspection and palpation Lungs: clear to auscultation bilaterally Heart: regular rate and rhythm Abdomen: soft, non-tender; bowel sounds normal; no masses,  no organomegaly Extremities: extremities normal, atraumatic, no cyanosis or edema Skin: Skin color, texture, turgor normal. No rashes or lesions Lymph nodes: Cervical, supraclavicular, and axillary nodes normal. No abnormal inguinal nodes palpated Neurologic: Grossly normal  Pelvic: External genitalia:  no lesions              Urethra:  normal appearing urethra with no masses, tenderness or lesions              Bartholins and Skenes: normal                 Vagina: normal  appearing vagina with normal color and discharge, no lesions              Cervix: no lesions             Bimanual Exam:  Uterus:  16 week size pelvic mass.  Fills pelvis and is slightly mobile.  Uterus not well palpated.              Adnexa:  See above.              Rectovaginal: Yes.  .  Confirms.              Anus:  normal sphincter tone, no lesions  Chaperone was present for exam.  ASSESSMENT  Pelvic mass.  Benign dermoid expected. HRT patient.   PLAN  Proceed with total abdominal hysterectomy with bilateral salpingo-oophorectomy and collection of pelvic washings. Risks, benefits, and alternatives discussed with the patient who wishes to proceed.  Risks include but are not limited to bleeding, infection, damage to surrounding organs, reaction to anesthesia, pneumonia, DVT, PE, death, hernia formation, and need for reoperation including GYN oncology surgical staging in the future if a malignancy is detected. Surgical expectations and recovery discussed. Patient is prepared for a possible vertical midline incision.    An After Visit Summary was printed and given to the patient.  ___25___ minutes face to face time of which over 50% was spent in counseling.

## 2015-04-13 NOTE — Telephone Encounter (Signed)
Called patient to review benefits for an upcoming procedure. Left Voicemail requesting a call back.

## 2015-04-13 NOTE — Progress Notes (Signed)
After consult visit with Dr Quincy Simmonds, surgical instruction sheet reviewed with patient and printed copy provided. See copy scanned to chart. Patient aware surgery is scheduled for 04-28-15 at 0730 at [facility] and to arrive at 0600.

## 2015-04-15 ENCOUNTER — Telehealth: Payer: Self-pay | Admitting: Obstetrics and Gynecology

## 2015-04-15 NOTE — Telephone Encounter (Signed)
Patient is scheduled for a procedure on 04/28/15, which requires prior approval. Faxed clinicals to [facility] Case Review Nurse (fax number [phone]). Called [facility] today, spoke with Vaughan Basta, she confirmed fax was received and the review is still pending. Will follow up on 04/17/15. Ok to close

## 2015-04-16 ENCOUNTER — Encounter (HOSPITAL_COMMUNITY): Payer: Self-pay

## 2015-04-16 ENCOUNTER — Encounter (HOSPITAL_COMMUNITY)
Admission: RE | Admit: 2015-04-16 | Discharge: 2015-04-16 | Disposition: A | Discharge status: Home / Self Care | Payer: Commercial Managed Care - PPO | Source: Ambulatory Visit | Attending: Obstetrics and Gynecology | Admitting: Obstetrics and Gynecology

## 2015-04-16 DIAGNOSIS — R1900 Intra-abdominal and pelvic swelling, mass and lump, unspecified site: Secondary | ICD-10-CM | POA: Insufficient documentation

## 2015-04-16 DIAGNOSIS — Z01812 Encounter for preprocedural laboratory examination: Secondary | ICD-10-CM | POA: Insufficient documentation

## 2015-04-16 DIAGNOSIS — R900 Intracranial space-occupying lesion found on diagnostic imaging of central nervous system: Secondary | ICD-10-CM | POA: Diagnosis present

## 2015-04-16 HISTORY — DX: Pneumonia, unspecified organism: J18.9

## 2015-04-16 LAB — CBC
HCT: 39.3 % (ref 36.0–46.0)
HEMOGLOBIN: 13 g/dL (ref 12.0–15.0)
MCH: 32.3 pg (ref 26.0–34.0)
MCHC: 33.1 g/dL (ref 30.0–36.0)
MCV: 97.8 fL (ref 78.0–100.0)
Platelets: 280 10*3/uL (ref 150–400)
RBC: 4.02 MIL/uL (ref 3.87–5.11)
RDW: 12.5 % (ref 11.5–15.5)
WBC: 6 10*3/uL (ref 4.0–10.5)

## 2015-04-16 LAB — COMPREHENSIVE METABOLIC PANEL
ALBUMIN: 4.4 g/dL (ref 3.5–5.0)
ALK PHOS: 63 U/L (ref 38–126)
ALT: 19 U/L (ref 14–54)
AST: 25 U/L (ref 15–41)
Anion gap: 6 (ref 5–15)
BUN: 18 mg/dL (ref 6–20)
CALCIUM: 9.5 mg/dL (ref 8.9–10.3)
CHLORIDE: 107 mmol/L (ref 101–111)
CO2: 28 mmol/L (ref 22–32)
CREATININE: 0.57 mg/dL (ref 0.44–1.00)
GFR calc non Af Amer: >60 mL/min (ref >60)
GLUCOSE: 101 mg/dL — AB (ref 65–99)
Potassium: 4.1 mmol/L (ref 3.5–5.1)
SODIUM: 141 mmol/L (ref 135–145)
Total Bilirubin: 0.9 mg/dL (ref 0.3–1.2)
Total Protein: 7.1 g/dL (ref 6.5–8.1)

## 2015-04-16 LAB — APTT: aPTT: 28 seconds (ref 24–37)

## 2015-04-16 LAB — TYPE AND SCREEN
ABO/RH(D): O POS
Antibody Screen: NEGATIVE

## 2015-04-16 LAB — ABO/RH: ABO/RH(D): O POS

## 2015-04-16 LAB — PROTIME-INR
INR: 0.97 (ref 0.00–1.49)
Prothrombin Time: 13.1 seconds (ref 11.6–15.2)

## 2015-04-16 NOTE — Patient Instructions (Signed)
Your procedure is scheduled on: April 28, 2015    Enter through the Main Entrance of [facility] at: 6:00 am   Pick up the phone at the desk and dial [phone].  Call this number if you have problems the morning of surgery: [phone].  Remember: Do NOT eat food: after midnight on Monday the 27th Do NOT drink clear liquids after: midnight on Monday the 27th  Take these medicines the morning of surgery with a SIP OF WATER: none   Do NOT wear jewelry (body piercing), metal hair clips/bobby pins, or nail polish. Do NOT wear lotions, powders, or perfumes.  You may wear deoderant. Do NOT shave for 48 hours prior to surgery. Do NOT bring valuables to the hospital. Contacts, dentures, or bridgework may not be worn into surgery. Leave suitcase in car.  After surgery it may be brought to your room.  For patients admitted to the hospital, checkout time is 11:00 AM the day of discharge.

## 2015-04-22 ENCOUNTER — Telehealth: Payer: Self-pay | Admitting: Obstetrics and Gynecology

## 2015-04-22 NOTE — Telephone Encounter (Signed)
Patient is scheduled for surgery on 04/28/15. Clinicals were faxed on 04/13/15 to fax number [phone]. I was advised on 04/15/15 by Vaughan Basta, request received and review is still pending.  I called on 04/21/15 advised by Jaquelyn Bitter, review is still pending. I have called today and spoke with Letta Median, she advised still in pending status for Nurse Review. Letta Median referred me to the Nurse, Darci Needle at [phone] extn [phone].  I have left a message for shelia to return my call and to advise what we can do to  expedite review.

## 2015-04-22 NOTE — Telephone Encounter (Signed)
I left a message earlier today for, [facility] Nurse Case Manager, Darci Needle [phone] extn [phone], call has not been returned. I called again and was required to leave another voicemail. I asked in the message if there was anything we could do, such as provided additional information or peer to peer review, anything to expedite the review to please let me know, or contact our Nurse Supervisor, Gay Filler. I reiterated the information was faxed on 04/13/15 and the surgery is scheduled for 04/28/15.

## 2015-04-23 NOTE — Telephone Encounter (Signed)
Shelia from [facility] left a message on our voice mail in regards to patient. She has authorized Abdominal Hysterectomy for 2 days as a in-patient for dates 2/28-04/30/15. Reference number [id number]

## 2015-04-27 MED ORDER — DEXTROSE 5 % IV SOLN
2.0000 g | INTRAVENOUS | Status: AC
  Administered 2015-04-28: 2 g via INTRAVENOUS
  Filled 2015-04-27: qty 2

## 2015-04-27 NOTE — H&P (Signed)
Nunzio Cobbs, MD at 04/13/2015 9:04 AM     Status: Signed       Expand All Collapse All   Patient ID: [name], female DOB: Dec 29, 1955, 60 y.o. MRN: [id number] GYNECOLOGY VISIT  HPI: 60 y.o. Married Caucasian female  G1P0000 with Patient's last menstrual period was 02/28/2009 (approximate).  here for Surgical Consult.   Wants hysterectomy.   Has a large 15 cm pelvic mass consistent with a dermoid cyst confirmed by pelvic ultrasound and pelvic/abdominal CT scan. Patient also has a 16 mm fibroid of the uterus. Dr. Denman George has consulted and believes this is a benign ovarian cyst and that she does not need to perform the patient's surgery. CA125 10, CEA 2.5.  GYNECOLOGIC HISTORY: Patient's last menstrual period was 02/28/2009 (approximate). Contraception:Postmenopausal Menopausal hormone therapy: FemHrt 0.5/2-65m/mc Last mammogram: 08-12-14 Density Cat.B/Neg/BiRads1:[facility] Last pap smear: 02-16-15 Neg   OB History    Gravida Para Term Preterm AB TAB SAB Ectopic Multiple Living   1 0 0  0     0       Patient Active Problem List   Diagnosis Date Noted  . Dermoid cyst 04/06/2015    Past Medical History  Diagnosis Date  . Osteoarthritis     Past Surgical History  Procedure Laterality Date  . Toe surgery Right 20 years ago    right small toe  . Rectal nodule removal  05/2012    --benign with Dr. AElmo PuttThomas([facility])    Current Outpatient Prescriptions  Medication Sig Dispense Refill  . calcium-vitamin D (CALCIUM 500/D) 500-200 MG-UNIT tablet Take 1 tablet by mouth 2 (two) times daily.    . fish oil-omega-3 fatty acids 1000 MG capsule Take 1 g by mouth daily.    .Marland KitchenGLUCOSAMINE CHONDROITIN COMPLX PO Take by mouth daily.    . Multiple Vitamin (MULTIVITAMIN) tablet Take 1 tablet by mouth daily.    . norethindrone-ethinyl estradiol (FEMHRT LOW DOSE)  0.5-2.5 MG-MCG per tablet Take 1 tablet by mouth daily.     No current facility-administered medications for this visit.     ALLERGIES: Review of patient's allergies indicates no known allergies.  Family History  Problem Relation Age of Onset  . Cancer Maternal Grandmother 435   Colon Ca--Dec age 7131from age related problems  . Asthma Maternal Grandmother   . Hypertension Mother   . Thyroid disease Mother     parathyroid Dz  . Hyperlipidemia Brother     Social History   Social History  . Marital Status: Married    Spouse Name: N/A  . Number of Children: N/A  . Years of Education: N/A   Occupational History  . Not on file.   Social History Main Topics  . Smoking status: Former Smoker    Quit date: 02/28/1994  . Smokeless tobacco: Not on file  . Alcohol Use: 4.2 - 6.0 oz/week    7-10 Standard drinks or equivalent per week     Comment: 1-2 glasses of wine per day  . Drug Use: No  . Sexual Activity:    Partners: Male    Birth Control/ Protection: Post-menopausal   Other Topics Concern  . Not on file   Social History Narrative    ROS: Pertinent items are noted in HPI.  PHYSICAL EXAMINATION:   BP 122/70 mmHg  Pulse 76  Ht '5\' 3"'$  (1.6 m)  Wt 137 lb 12.8 oz (62.506 kg)  BMI 24.42 kg/m2  LMP 02/28/2009 (Approximate)  General appearance:  alert, cooperative and appears stated age Head: Normocephalic, without obvious abnormality, atraumatic Neck: no adenopathy, supple, symmetrical, trachea midline and thyroid normal to inspection and palpation Lungs: clear to auscultation bilaterally Heart: regular rate and rhythm Abdomen: soft, non-tender; bowel sounds normal; no masses, no organomegaly Extremities: extremities normal, atraumatic, no cyanosis or edema Skin: Skin color, texture, turgor normal. No rashes or lesions Lymph nodes: Cervical, supraclavicular,  and axillary nodes normal. No abnormal inguinal nodes palpated Neurologic: Grossly normal  Pelvic: External genitalia: no lesions  Urethra: normal appearing urethra with no masses, tenderness or lesions  Bartholins and Skenes: normal   Vagina: normal appearing vagina with normal color and discharge, no lesions  Cervix: no lesions   Bimanual Exam: Uterus: 16 week size pelvic mass. Fills pelvis and is slightly mobile. Uterus not well palpated.  Adnexa:  See above.  Rectovaginal: Yes. . Confirms.  Anus: normal sphincter tone, no lesions  Chaperone was present for exam.  ASSESSMENT  Pelvic mass. Benign dermoid expected. HRT patient.   PLAN  Proceed with total abdominal hysterectomy with bilateral salpingo-oophorectomy and collection of pelvic washings. Risks, benefits, and alternatives discussed with the patient who wishes to proceed.  Risks include but are not limited to bleeding, infection, damage to surrounding organs, reaction to anesthesia, pneumonia, DVT, PE, death, hernia formation, and need for reoperation including GYN oncology surgical staging in the future if a malignancy is detected. Surgical expectations and recovery discussed. Patient is prepared for a possible vertical midline incision.   An After Visit Summary was printed and given to the patient.  ___25___ minutes face to face time of which over 50% was spent in counseling.

## 2015-04-28 ENCOUNTER — Encounter (HOSPITAL_COMMUNITY)
Admission: RE | Disposition: A | Discharge status: Home / Self Care | Payer: Self-pay | Source: Ambulatory Visit | Attending: Obstetrics and Gynecology

## 2015-04-28 ENCOUNTER — Inpatient Hospital Stay (HOSPITAL_COMMUNITY)
Admission: RE | Admit: 2015-04-28 | Discharge: 2015-04-30 | DRG: 743 | Disposition: A | Discharge status: Home / Self Care | Payer: Commercial Managed Care - PPO | Source: Ambulatory Visit | Attending: Obstetrics and Gynecology | Admitting: Obstetrics and Gynecology

## 2015-04-28 ENCOUNTER — Encounter (HOSPITAL_COMMUNITY): Payer: Self-pay | Admitting: Anesthesiology

## 2015-04-28 ENCOUNTER — Inpatient Hospital Stay (HOSPITAL_COMMUNITY): Payer: Commercial Managed Care - PPO | Admitting: Anesthesiology

## 2015-04-28 DIAGNOSIS — N852 Hypertrophy of uterus: Secondary | ICD-10-CM | POA: Diagnosis not present

## 2015-04-28 DIAGNOSIS — Z9079 Acquired absence of other genital organ(s): Secondary | ICD-10-CM

## 2015-04-28 DIAGNOSIS — T402X5A Adverse effect of other opioids, initial encounter: Secondary | ICD-10-CM | POA: Diagnosis not present

## 2015-04-28 DIAGNOSIS — L299 Pruritus, unspecified: Secondary | ICD-10-CM | POA: Diagnosis not present

## 2015-04-28 DIAGNOSIS — Z87891 Personal history of nicotine dependence: Secondary | ICD-10-CM | POA: Diagnosis not present

## 2015-04-28 DIAGNOSIS — D271 Benign neoplasm of left ovary: Principal | ICD-10-CM | POA: Diagnosis present

## 2015-04-28 DIAGNOSIS — N839 Noninflammatory disorder of ovary, fallopian tube and broad ligament, unspecified: Secondary | ICD-10-CM

## 2015-04-28 DIAGNOSIS — M1990 Unspecified osteoarthritis, unspecified site: Secondary | ICD-10-CM | POA: Diagnosis present

## 2015-04-28 DIAGNOSIS — D369 Benign neoplasm, unspecified site: Secondary | ICD-10-CM

## 2015-04-28 DIAGNOSIS — Z90722 Acquired absence of ovaries, bilateral: Secondary | ICD-10-CM

## 2015-04-28 DIAGNOSIS — Z90710 Acquired absence of both cervix and uterus: Secondary | ICD-10-CM

## 2015-04-28 DIAGNOSIS — R1900 Intra-abdominal and pelvic swelling, mass and lump, unspecified site: Secondary | ICD-10-CM | POA: Diagnosis present

## 2015-04-28 HISTORY — PX: SALPINGOOPHORECTOMY: SHX82

## 2015-04-28 HISTORY — PX: ABDOMINAL HYSTERECTOMY: SHX81

## 2015-04-28 SURGERY — SALPINGO-OOPHORECTOMY, OPEN
Anesthesia: General

## 2015-04-28 MED ORDER — LACTATED RINGERS IV SOLN
INTRAVENOUS | Status: DC
Start: 2015-04-28 — End: 2015-04-28

## 2015-04-28 MED ORDER — MIDAZOLAM HCL 5 MG/5ML IJ SOLN
INTRAMUSCULAR | Status: DC | PRN
  Administered 2015-04-28: 2 mg via INTRAVENOUS

## 2015-04-28 MED ORDER — OXYCODONE-ACETAMINOPHEN 5-325 MG PO TABS
1.0000 | ORAL_TABLET | ORAL | Status: DC | PRN
  Administered 2015-04-29 – 2015-04-30 (×5): 1 via ORAL
  Filled 2015-04-28 (×6): qty 1

## 2015-04-28 MED ORDER — LIDOCAINE HCL (CARDIAC) 20 MG/ML IV SOLN
INTRAVENOUS | Status: DC | PRN
  Administered 2015-04-28: 100 mg via INTRAVENOUS

## 2015-04-28 MED ORDER — DIPHENHYDRAMINE HCL 50 MG/ML IJ SOLN
12.5000 mg | Freq: Four times a day (QID) | INTRAMUSCULAR | Status: DC | PRN
  Administered 2015-04-28: 12.5 mg via INTRAVENOUS
  Filled 2015-04-28: qty 1

## 2015-04-28 MED ORDER — MORPHINE SULFATE 2 MG/ML IV SOLN
INTRAVENOUS | Status: DC
  Administered 2015-04-28: 14.5 mg via INTRAVENOUS
  Administered 2015-04-28 – 2015-04-29 (×2): 4.5 mg via INTRAVENOUS
  Administered 2015-04-29: 4 mg via INTRAVENOUS
  Administered 2015-04-29: 1 mg via INTRAVENOUS
  Filled 2015-04-28: qty 25

## 2015-04-28 MED ORDER — MENTHOL 3 MG MT LOZG
1.0000 | LOZENGE | OROMUCOSAL | Status: DC | PRN
  Administered 2015-04-28: 3 mg via ORAL
  Filled 2015-04-28: qty 9

## 2015-04-28 MED ORDER — PROPOFOL 10 MG/ML IV BOLUS
INTRAVENOUS | Status: DC | PRN
Start: 2015-04-28 — End: 2015-04-28
  Administered 2015-04-28: 200 mg via INTRAVENOUS

## 2015-04-28 MED ORDER — BUPIVACAINE LIPOSOME 1.3 % IJ SUSP
20.0000 mL | Freq: Once | INTRAMUSCULAR | Status: AC
  Administered 2015-04-28: 20 mL
  Filled 2015-04-28: qty 20

## 2015-04-28 MED ORDER — MIDAZOLAM HCL 2 MG/2ML IJ SOLN
INTRAMUSCULAR | Status: AC
  Filled 2015-04-28: qty 2

## 2015-04-28 MED ORDER — DIPHENHYDRAMINE HCL 12.5 MG/5ML PO ELIX: 12.5000 mg | ORAL_SOLUTION | Freq: Four times a day (QID) | ORAL | Status: DC | PRN

## 2015-04-28 MED ORDER — HYDROMORPHONE HCL 1 MG/ML IJ SOLN
INTRAMUSCULAR | Status: AC
  Administered 2015-04-28: 0.5 mg via INTRAVENOUS
  Filled 2015-04-28: qty 1

## 2015-04-28 MED ORDER — SODIUM CHLORIDE 0.9% FLUSH: 9.0000 mL | INTRAVENOUS | Status: DC | PRN

## 2015-04-28 MED ORDER — ROCURONIUM BROMIDE 100 MG/10ML IV SOLN
INTRAVENOUS | Status: DC | PRN
Start: 2015-04-28 — End: 2015-04-28
  Administered 2015-04-28: 60 mg via INTRAVENOUS
  Administered 2015-04-28: 10 mg via INTRAVENOUS

## 2015-04-28 MED ORDER — PROMETHAZINE HCL 25 MG/ML IJ SOLN: 6.2500 mg | INTRAMUSCULAR | Status: DC | PRN

## 2015-04-28 MED ORDER — FENTANYL CITRATE (PF) 100 MCG/2ML IJ SOLN
INTRAMUSCULAR | Status: DC | PRN
  Administered 2015-04-28 (×2): 50 ug via INTRAVENOUS
  Administered 2015-04-28: 100 ug via INTRAVENOUS
  Administered 2015-04-28: 50 ug via INTRAVENOUS

## 2015-04-28 MED ORDER — LACTATED RINGERS IV SOLN
INTRAVENOUS | Status: DC
  Administered 2015-04-28 – 2015-04-29 (×2): via INTRAVENOUS

## 2015-04-28 MED ORDER — PROPOFOL 10 MG/ML IV BOLUS
INTRAVENOUS | Status: AC
  Filled 2015-04-28: qty 20

## 2015-04-28 MED ORDER — SODIUM CHLORIDE 0.9 % IJ SOLN
INTRAMUSCULAR | Status: DC | PRN
  Administered 2015-04-28: 30 mL

## 2015-04-28 MED ORDER — HYDROMORPHONE HCL 1 MG/ML IJ SOLN
0.2500 mg | INTRAMUSCULAR | Status: DC | PRN
  Administered 2015-04-28: 0.5 mg via INTRAVENOUS

## 2015-04-28 MED ORDER — ONDANSETRON HCL 4 MG PO TABS: 4.0000 mg | ORAL_TABLET | Freq: Four times a day (QID) | ORAL | Status: DC | PRN

## 2015-04-28 MED ORDER — IBUPROFEN 600 MG PO TABS
600.0000 mg | ORAL_TABLET | Freq: Four times a day (QID) | ORAL | Status: DC | PRN
  Administered 2015-04-29 – 2015-04-30 (×4): 600 mg via ORAL
  Filled 2015-04-28 (×5): qty 1

## 2015-04-28 MED ORDER — KETOROLAC TROMETHAMINE 30 MG/ML IJ SOLN
INTRAMUSCULAR | Status: AC
  Filled 2015-04-28: qty 1

## 2015-04-28 MED ORDER — LACTATED RINGERS IV SOLN
INTRAVENOUS | Status: DC
  Administered 2015-04-28 (×4): via INTRAVENOUS

## 2015-04-28 MED ORDER — LIDOCAINE HCL (CARDIAC) 20 MG/ML IV SOLN
INTRAVENOUS | Status: AC
  Filled 2015-04-28: qty 5

## 2015-04-28 MED ORDER — SCOPOLAMINE 1 MG/3DAYS TD PT72
MEDICATED_PATCH | TRANSDERMAL | Status: AC
  Filled 2015-04-28: qty 1

## 2015-04-28 MED ORDER — NEOSTIGMINE METHYLSULFATE 10 MG/10ML IV SOLN
INTRAVENOUS | Status: DC | PRN
  Administered 2015-04-28: 4 mg via INTRAVENOUS

## 2015-04-28 MED ORDER — BUPIVACAINE HCL (PF) 0.25 % IJ SOLN
INTRAMUSCULAR | Status: AC
  Filled 2015-04-28: qty 30

## 2015-04-28 MED ORDER — EPHEDRINE SULFATE 50 MG/ML IJ SOLN
INTRAMUSCULAR | Status: DC | PRN
  Administered 2015-04-28 (×3): 5 mg via INTRAVENOUS

## 2015-04-28 MED ORDER — ROCURONIUM BROMIDE 100 MG/10ML IV SOLN
INTRAVENOUS | Status: AC
  Filled 2015-04-28: qty 1

## 2015-04-28 MED ORDER — HYDROMORPHONE HCL 1 MG/ML IJ SOLN
INTRAMUSCULAR | Status: DC | PRN
  Administered 2015-04-28 (×2): 1 mg via INTRAVENOUS

## 2015-04-28 MED ORDER — ONDANSETRON HCL 4 MG/2ML IJ SOLN
INTRAMUSCULAR | Status: AC
  Filled 2015-04-28: qty 2

## 2015-04-28 MED ORDER — DEXAMETHASONE SODIUM PHOSPHATE 10 MG/ML IJ SOLN
INTRAMUSCULAR | Status: DC | PRN
  Administered 2015-04-28: 10 mg via INTRAVENOUS

## 2015-04-28 MED ORDER — ONDANSETRON HCL 4 MG/2ML IJ SOLN
INTRAMUSCULAR | Status: DC | PRN
  Administered 2015-04-28: 4 mg via INTRAVENOUS

## 2015-04-28 MED ORDER — HYDROMORPHONE HCL 1 MG/ML IJ SOLN
INTRAMUSCULAR | Status: AC
  Filled 2015-04-28: qty 1

## 2015-04-28 MED ORDER — LACTATED RINGERS IV BOLUS (SEPSIS)
500.0000 mL | Freq: Once | INTRAVENOUS | Status: AC
  Administered 2015-04-28: 500 mL via INTRAVENOUS

## 2015-04-28 MED ORDER — SCOPOLAMINE 1 MG/3DAYS TD PT72
1.0000 | MEDICATED_PATCH | Freq: Once | TRANSDERMAL | Status: DC
  Administered 2015-04-28: 1.5 mg via TRANSDERMAL

## 2015-04-28 MED ORDER — KETOROLAC TROMETHAMINE 15 MG/ML IJ SOLN
15.0000 mg | Freq: Four times a day (QID) | INTRAMUSCULAR | Status: AC
  Administered 2015-04-28 – 2015-04-29 (×3): 15 mg via INTRAVENOUS
  Filled 2015-04-28 (×5): qty 1

## 2015-04-28 MED ORDER — SODIUM CHLORIDE 0.9 % IJ SOLN
INTRAMUSCULAR | Status: AC
  Filled 2015-04-28: qty 50

## 2015-04-28 MED ORDER — DEXAMETHASONE SODIUM PHOSPHATE 10 MG/ML IJ SOLN
INTRAMUSCULAR | Status: AC
  Filled 2015-04-28: qty 1

## 2015-04-28 MED ORDER — KETOROLAC TROMETHAMINE 30 MG/ML IJ SOLN
INTRAMUSCULAR | Status: DC | PRN
  Administered 2015-04-28: 30 mg via INTRAVENOUS

## 2015-04-28 MED ORDER — ONDANSETRON HCL 4 MG/2ML IJ SOLN: 4.0000 mg | Freq: Four times a day (QID) | INTRAMUSCULAR | Status: DC | PRN

## 2015-04-28 MED ORDER — HEPARIN SODIUM (PORCINE) 5000 UNIT/ML IJ SOLN
INTRAMUSCULAR | Status: AC
Start: 2015-04-28 — End: 2015-04-28
  Filled 2015-04-28: qty 1

## 2015-04-28 MED ORDER — NALOXONE HCL 0.4 MG/ML IJ SOLN: 0.4000 mg | INTRAMUSCULAR | Status: DC | PRN

## 2015-04-28 MED ORDER — FENTANYL CITRATE (PF) 250 MCG/5ML IJ SOLN
INTRAMUSCULAR | Status: AC
  Filled 2015-04-28: qty 5

## 2015-04-28 MED ORDER — GLYCOPYRROLATE 0.2 MG/ML IJ SOLN
INTRAMUSCULAR | Status: DC | PRN
  Administered 2015-04-28: .6 mg via INTRAVENOUS

## 2015-04-28 MED ORDER — EPHEDRINE 5 MG/ML INJ
INTRAVENOUS | Status: AC
  Filled 2015-04-28: qty 10

## 2015-04-28 SURGICAL SUPPLY — 42 items
BENZOIN TINCTURE PRP APPL 2/3 (GAUZE/BANDAGES/DRESSINGS) ×4 IMPLANT
CANISTER SUCT 3000ML (MISCELLANEOUS) ×4 IMPLANT
CATH FOLEY 3WAY  5CC 16FR (CATHETERS)
CATH FOLEY 3WAY 5CC 16FR (CATHETERS) IMPLANT
CELLS DAT CNTRL 66122 CELL SVR (MISCELLANEOUS) IMPLANT
CLOSURE WOUND 1/2 X4 (GAUZE/BANDAGES/DRESSINGS) ×1
CLOTH BEACON ORANGE TIMEOUT ST (SAFETY) ×4 IMPLANT
DECANTER SPIKE VIAL GLASS SM (MISCELLANEOUS) ×4 IMPLANT
DRAPE UNDERBUTTOCKS STRL (DRAPE) ×4 IMPLANT
DRAPE WARM FLUID 44X44 (DRAPE) ×4 IMPLANT
DRSG OPSITE POSTOP 4X10 (GAUZE/BANDAGES/DRESSINGS) ×4 IMPLANT
DURAPREP 26ML APPLICATOR (WOUND CARE) ×4 IMPLANT
GAUZE SPONGE 4X4 16PLY XRAY LF (GAUZE/BANDAGES/DRESSINGS) ×4 IMPLANT
GLOVE BIO SURGEON STRL SZ 6.5 (GLOVE) ×3 IMPLANT
GLOVE BIO SURGEONS STRL SZ 6.5 (GLOVE) ×1
GLOVE BIOGEL PI IND STRL 7.0 (GLOVE) ×6 IMPLANT
GLOVE BIOGEL PI INDICATOR 7.0 (GLOVE) ×6
GOWN STRL REUS W/TWL LRG LVL3 (GOWN DISPOSABLE) ×12 IMPLANT
NEEDLE HYPO 22GX1.5 SAFETY (NEEDLE) IMPLANT
NS IRRIG 1000ML POUR BTL (IV SOLUTION) ×8 IMPLANT
PACK ABDOMINAL GYN (CUSTOM PROCEDURE TRAY) ×4 IMPLANT
PAD OB MATERNITY 4.3X12.25 (PERSONAL CARE ITEMS) ×4 IMPLANT
RETRACTOR WND ALEXIS 25 LRG (MISCELLANEOUS) IMPLANT
RTRCTR WOUND ALEXIS 18CM MED (MISCELLANEOUS)
RTRCTR WOUND ALEXIS 25CM LRG (MISCELLANEOUS)
SET CYSTO W/LG BORE CLAMP LF (SET/KITS/TRAYS/PACK) IMPLANT
SHEET LAVH (DRAPES) ×4 IMPLANT
SPONGE LAP 18X18 X RAY DECT (DISPOSABLE) ×12 IMPLANT
STAPLER VISISTAT 35W (STAPLE) IMPLANT
STRIP CLOSURE SKIN 1/2X4 (GAUZE/BANDAGES/DRESSINGS) ×3 IMPLANT
SUT PLAIN 2 0 XLH (SUTURE) ×4 IMPLANT
SUT VIC AB 0 CT1 18XCR BRD8 (SUTURE) ×4 IMPLANT
SUT VIC AB 0 CT1 27 (SUTURE) ×6
SUT VIC AB 0 CT1 27XBRD ANBCTR (SUTURE) ×6 IMPLANT
SUT VIC AB 0 CT1 8-18 (SUTURE) ×4
SUT VIC AB 2-0 CT1 27 (SUTURE) ×2
SUT VIC AB 2-0 CT1 TAPERPNT 27 (SUTURE) ×2 IMPLANT
SUT VIC AB 4-0 PS2 27 (SUTURE) ×4 IMPLANT
SUT VICRYL 0 TIES 12 18 (SUTURE) ×4 IMPLANT
SYR CONTROL 10ML LL (SYRINGE) IMPLANT
TOWEL OR 17X24 6PK STRL BLUE (TOWEL DISPOSABLE) ×8 IMPLANT
TRAY FOLEY BAG SILVER LF 16FR (SET/KITS/TRAYS/PACK) ×4 IMPLANT

## 2015-04-28 NOTE — Transfer of Care (Signed)
Immediate Anesthesia Transfer of Care Note  Patient: [name]  Procedure(s) Performed: Procedure(s): BILATERAL SALPINGO OOPHORECTOMY WITH PELVIC WASHINGS  (Bilateral) TOTAL HYSTERECTOMY ABDOMINAL (N/A)  Patient Location: PACU  Anesthesia Type:General  Level of Consciousness: awake, alert  and oriented  Airway & Oxygen Therapy: Patient Spontanous Breathing and Patient connected to nasal cannula oxygen  Post-op Assessment: Report given to RN and Post -op Vital signs reviewed and stable  Post vital signs: Reviewed and stable  Last Vitals:  Filed Vitals:   04/28/15 0603  BP: 126/81  Pulse: 77  Temp: 36.7 C  Resp: 20    Complications: No apparent anesthesia complications

## 2015-04-28 NOTE — Anesthesia Preprocedure Evaluation (Signed)
Anesthesia Evaluation  Patient identified by MRN, date of birth, ID band Patient awake    Reviewed: Allergy & Precautions, NPO status , Patient's Chart, lab work & pertinent test results  Airway Mallampati: II  TM Distance: >3 FB Neck ROM: Full    Dental no notable dental hx.    Pulmonary neg pulmonary ROS, former smoker,    Pulmonary exam normal breath sounds clear to auscultation       Cardiovascular negative cardio ROS Normal cardiovascular exam Rhythm:Regular Rate:Normal     Neuro/Psych negative neurological ROS  negative psych ROS   GI/Hepatic negative GI ROS, Neg liver ROS,   Endo/Other  negative endocrine ROS  Renal/GU negative Renal ROS  negative genitourinary   Musculoskeletal negative musculoskeletal ROS (+)   Abdominal   Peds negative pediatric ROS (+)  Hematology negative hematology ROS (+)   Anesthesia Other Findings   Reproductive/Obstetrics negative OB ROS                             Anesthesia Physical Anesthesia Plan  ASA: I  Anesthesia Plan: General   Post-op Pain Management:    Induction: Intravenous  Airway Management Planned: Oral ETT  Additional Equipment:   Intra-op Plan:   Post-operative Plan: Extubation in OR  Informed Consent: I have reviewed the patients History and Physical, chart, labs and discussed the procedure including the risks, benefits and alternatives for the proposed anesthesia with the patient or authorized representative who has indicated his/her understanding and acceptance.   Dental advisory given  Plan Discussed with: CRNA and Surgeon  Anesthesia Plan Comments:         Anesthesia Quick Evaluation

## 2015-04-28 NOTE — Anesthesia Postprocedure Evaluation (Signed)
Anesthesia Post Note  Patient: CATHELEEN LANGHORNE  Procedure(s) Performed: Procedure(s) (LRB): BILATERAL SALPINGO OOPHORECTOMY WITH PELVIC WASHINGS  (Bilateral) TOTAL HYSTERECTOMY ABDOMINAL (N/A)  Patient location during evaluation: [facility] Anesthesia Type: General Level of consciousness: awake and alert Pain management: pain level controlled Vital Signs Assessment: post-procedure vital signs reviewed and stable Respiratory status: spontaneous breathing and patient connected to nasal cannula oxygen Cardiovascular status: stable Postop Assessment: no signs of nausea or vomiting and adequate PO intake Anesthetic complications: no    Last Vitals:  Filed Vitals:   04/28/15 1114 04/28/15 1215  BP: 99/49 98/52  Pulse: 65 79  Temp: 36.8 C 36.4 C  Resp: 14 16    Last Pain:  Filed Vitals:   04/28/15 1220  PainSc: [place]

## 2015-04-28 NOTE — Op Note (Signed)
[name], [name]            ACCOUNT NO.:  [id number]  MEDICAL RECORD NO.:  [id number]  LOCATION:  8527                          FACILITY:  [facility]  PHYSICIAN:  Lenard Galloway, M.D.   DATE OF BIRTH:  October 25, 1955  DATE OF PROCEDURE:  04/28/2015 DATE OF DISCHARGE:                              OPERATIVE REPORT   PRE PROCEDURE DIAGNOSIS:  Pelvic mass, suspected dermoid cyst.  POSTOPERATIVE DIAGNOSIS:  Left ovarian cyst, suspected dermoid cyst.  PROCEDURE:  Total abdominal hysterectomy with bilateral salpingo- oophorectomy, lysis of adhesions, and collection of pelvic washings.  SURGEON:  Lenard Galloway, M.D.  ASSISTANT:  Megan Salon, MD.  ANESTHESIA:  General endotracheal, Exparel in the subcutaneous layer.  IV FLUIDS:  3000 mL of Ringer's lactate.  EBL:  100 mL.  URINE OUTPUT:  300 mL.  COMPLICATIONS:  None.  INDICATIONS FOR THE PROCEDURE:  The patient is a 60 year old, Caucasian female, who presented for care with a large pelvic mass, initially thought to be a uterine fibroid.  Pelvic ultrasound in the office confirmed the presence of a 15 cm complex pelvic mass.  The patient had CA-125 and a CEA level, which were both normal.  Subsequently, a CT scan of the abdomen and pelvis was performed, and the patient was diagnosed with a 15 cm probable dermoid cyst of the ovary.  There was no evidence of any extra ovarian disease.  The patient did have GYN Oncology consultation with Dr. Denman George, who concurred that the mass was likely benign.  The patient was referred back to me for surgical care and a plan was made to proceed with a total abdominal hysterectomy with bilateral salpingo-oophorectomy and collection of pelvic washings after risks, benefits, and alternatives were reviewed.  FINDINGS:  Examination under anesthesia revealed a large pelvic mass which extended to approximately 3 cm below the umbilicus.  There was some mobility of the mass appreciated.  The uterus was  not palpated separately from the pelvic mass.  At the time of laparotomy, the patient was noted to have a 15 cm left ovarian cyst with palpable firm areas within the ovarian cyst which were previously identified as bones and teeth.  The ovary itself was very smooth and had no evidence of papillations, no excrescences.  There were filmy adhesions which tethered the ovarian cyst to the peritoneum of the posterior cul-de-sac and the posterior wall of the pelvis.  The right ovary was unremarkable as were the bilateral fallopian tubes and the uterus.  In the upper abdomen, there was no evidence of any adhesive disease. The liver, gallbladder, bilateral kidneys, periaortic region, and the appendix were all noted to be normal.  There was no evidence of any ascites fluid upon entry into the peritoneal cavity.  SPECIMENS:  Peritoneal washings were sent to Pathology separately from the uterus, cervix, bilateral tubes, and ovaries.  PROCEDURE IN DETAIL:  The patient was reidentified in the preoperative hold area.  She received cefotetan IV for antibiotic prophylaxis and TED hose and PAS stockings for DVT prophylaxis.  The patient was escorted to the operating room, where she was placed in the dorsal lithotomy position with Allen stirrups.  General endotracheal anesthesia was induced.  The patient's arms were supported at her sides with the appropriate arm board protectors.  An examination under anesthesia was performed prior to the patient being prepped and the decision was made to proceed with a Pfannenstiel incision.  The abdomen and vagina were sterilely prepped and a Foley catheter was sterilely placed inside the bladder.  The patient was then sterilely draped.  The Pfannenstiel incision was created sharply with a scalpel and dissection was brought through the subcutaneous layer using monopolar cautery for hemostasis.  The fascia was then incised with a scalpel, and the incision was  extended bilaterally with the Mayo scissors.  The rectus muscles were dissected off the fascia superiorly and inferiorly. The rectus muscles were divided in the midline.  The parietal peritoneum was elevated with 2 hemostat clamps and entered sharply with the Metzenbaum scissors.  The peritoneal incision was extended cranially and caudally.  At this time, pelvic washings were obtained and sent to Pathology.  Exploration of the pelvis revealed the large left ovarian cyst.  With a combination of sharp and blunt dissection, the adhesions of the ovary to the peritoneum were lysed in order to give mobility to the left ovary. Kelly clamps were placed across the adnexal structures bilaterally.  The left round ligament was identified and suture ligated with a transfixing suture of 0 Vicryl.  Monopolar cautery was then used to open up the broad ligament anteriorly and posteriorly.  The left infundibulopelvic ligament was identified along with the left ureter.  A window was created through the peritoneum in order to isolate the infundibulopelvic ligament which was doubly clamped, sharply divided, and then suture ligated with free tie of 0 Vicryl followed by a suture ligature of the same.  A clamp was then placed across the utero-ovarian ligament and the fallopian tube on the patient's left-hand side and the specimen was sharply excised and set aside for pathologic analysis.  The bladder flap was taken down anteriorly on the patient's left-hand side using monopolar cautery and a Metzenbaum scissors.  Attention was turned to the patient's right hand side where the right round ligament was isolated and sutured with a transfixing suture of 0 Vicryl.  Monopolar cautery was used to come through the round ligament. The anterior and posterior peritoneum was then opened with monopolar cautery and a Metzenbaum scissors.  Again, a window was created through the posterior peritoneum in order to isolate  the infundibulopelvic ligament.  The ureter was noted to be well below this.  The infundibulopelvic ligament was doubly clamped, sharply divided, and then suture ligated with a free tie of 0 Vicryl, followed by suture ligature of the same.  The bladder flap was taken down anteriorly using blunt dissection with monopolar cautery and using sharp dissection with the Metzenbaum scissors.  Each of the uterine arteries were skeletonized at this time. The uterine arteries were clamped, sharply divided, and suture ligated with 0 Vicryl sutures.  Each of the uterine artery pedicles received a second clamping of the inferior aspects of the vessels.  These were sharply divided and then suture ligated with 0 Vicryl bilaterally.  The uterosacral ligaments were then clamped, sharply divided, and suture ligated with transfixing sutures of 0 Vicryl.  Entry into the vagina was possible at this time.  The specimen was sharply excised and sent to Pathology.  The vaginal cuff was closed in the midline with 1 figure-of- eight suture of 0 Vicryl.  The pelvis was irrigated and suctioned at this time.  There was some  bleeding along the peritoneum of the left broad ligament which responded to monopolar cautery at this time.  Hemostasis was good.  All of the pedicles were examined and noted to be hemostatic.  The abdomen was closed at this time.  Moistened lap pads which had been used to retract the bowel into the upper abdomen were removed.  The parietal peritoneum was closed with a running suture of 2-0 Vicryl.  The rectus muscles were irrigated and suctioned and noted to be hemostatic. The fascia was closed with a running suture of 0 Vicryl bilaterally. The subcutaneous layer was injected with Exparel 30 mL diluted in 30 mL of normal saline.  The subcutaneous layer was irrigated and suctioned and made hemostatic with monopolar cautery.  Interrupted sutures of 2-0 plain were placed in the subcutaneous layer.   The skin was closed with a subcuticular suture of 4-0 Vicryl.  Steri-Strips and benzoin were placed over the incision.  A honeycomb dressing was placed over this.  At this time, the patient was awakened and extubated.  She was escorted to the recovery room in stable condition.  There were no complications to the procedure.  All needle, instrument, and sponge counts were correct.     Lenard Galloway, M.D.     BES/MEDQ  D:  04/28/2015  T:  04/28/2015  Job:  [id number]

## 2015-04-28 NOTE — Anesthesia Postprocedure Evaluation (Signed)
Anesthesia Post Note  Patient: [name]  Procedure(s) Performed: Procedure(s) (LRB): BILATERAL SALPINGO OOPHORECTOMY WITH PELVIC WASHINGS  (Bilateral) TOTAL HYSTERECTOMY ABDOMINAL (N/A)  Patient location during evaluation: PACU Anesthesia Type: General Level of consciousness: awake and alert Pain management: pain level controlled Vital Signs Assessment: post-procedure vital signs reviewed and stable Respiratory status: spontaneous breathing, nonlabored ventilation, respiratory function stable and patient connected to nasal cannula oxygen Cardiovascular status: blood pressure returned to baseline and stable Postop Assessment: no signs of nausea or vomiting Anesthetic complications: no    Last Vitals:  Filed Vitals:   04/28/15 0603  BP: 126/81  Pulse: 77  Temp: 36.7 C  Resp: 20    Last Pain: There were no vitals filed for this visit.               Pachia Strum S

## 2015-04-28 NOTE — Progress Notes (Signed)
Day of Surgery Procedure(s) (LRB): BILATERAL SALPINGO OOPHORECTOMY WITH PELVIC WASHINGS  (Bilateral) TOTAL HYSTERECTOMY ABDOMINAL (N/A)  Subjective: Patient reports good pain control.  Thirsty.  Wants to walk.  Husband present.  Objective: I have reviewed patient's vital signs and intake and output. T max 98.6, T now 97.9, BP 95/53, P 75, RR 16. I/O - 3320 cc/500 cc UO - urine concentrated.  General: alert and cooperative Resp: clear to auscultation bilaterally Cardio: regular rate and rhythm, S1, S2 normal, no murmur, click, rub or gallop GI: soft, non-tender; bowel sounds normal; no masses,  no organomegaly and dressing clean, dry, and intact.  Extremities: PAS and Ted hose on.  DPs 2+ bilaterally.  Vaginal Bleeding: none  Assessment: s/p Procedure(s): BILATERAL SALPINGO OOPHORECTOMY WITH PELVIC WASHINGS  (Bilateral) TOTAL HYSTERECTOMY ABDOMINAL (N/A): stable  Plan: Continue foley due to post op state.  LR bolus 500 cc over 2 hours.  Morphine PCA and Toradol.  CBC and BMP in am.  Surgical findings and procedure reviewed.  Questions answered.    LOS: 0 days    Arloa Koh 04/28/2015, 5:07 PM

## 2015-04-28 NOTE — Progress Notes (Signed)
Update to History and Physical  No marked change in status since office preop visit.   Patient examined.   Ok to proceed.

## 2015-04-28 NOTE — Brief Op Note (Signed)
04/28/2015  9:40 AM  PATIENT:  [name]  60 y.o. female  PRE-OPERATIVE DIAGNOSIS:  15 cm pelvic mass, probable dermoid  POST-OPERATIVE DIAGNOSIS:  15 cm pelvic mass, probable dermoid  PROCEDURE:  Procedure(s): BILATERAL SALPINGO OOPHORECTOMY WITH PELVIC WASHINGS  (Bilateral) TOTAL HYSTERECTOMY ABDOMINAL (N/A)  SURGEON:  Surgeon(s) and Role:    * Natalea Sutliff E Yisroel Ramming, MD - Primary    * Megan Salon, MD - Assisting  PHYSICIAN ASSISTANT: NA  ASSISTANTS: Megan Salon, MD   ANESTHESIA:   general and Experil in subcutaneous layer  EBL:  Total I/O In: 3000 [I.V.:3000] Out: 400 [Urine:300; Blood:100]  BLOOD ADMINISTERED:none  DRAINS: Urinary Catheter (Foley)   LOCAL MEDICATIONS USED:  OTHER Experil in subcutaneous layer.  SPECIMEN:  Source of Specimen:  Uterus, cervix, and bilateral tubes and ovaries to pathology  DISPOSITION OF SPECIMEN:  PATHOLOGY  COUNTS:  YES  TOURNIQUET:  * No tourniquets in log *  DICTATION: .Other Dictation: Dictation Number    PLAN OF CARE: Admit to inpatient   PATIENT DISPOSITION:  PACU - hemodynamically stable.   Delay start of Pharmacological VTE agent (>24hrs) due to surgical blood loss or risk of bleeding: not applicable

## 2015-04-28 NOTE — Anesthesia Procedure Notes (Signed)
Procedure Name: Intubation Date/Time: 04/28/2015 7:20 AM Performed by: Riki Sheer Pre-anesthesia Checklist: Patient identified, Emergency Drugs available, Suction available, Patient being monitored and Timeout performed Patient Re-evaluated:Patient Re-evaluated prior to inductionOxygen Delivery Method: Circle system utilized Preoxygenation: Pre-oxygenation with 100% oxygen Intubation Type: IV induction Ventilation: Mask ventilation without difficulty Laryngoscope Size: Miller and 2 Grade View: Grade I Tube type: Oral Tube size: 7.0 mm Number of attempts: 1 Airway Equipment and Method: Stylet Placement Confirmation: ETT inserted through vocal cords under direct vision,  positive ETCO2,  CO2 detector and breath sounds checked- equal and bilateral Secured at: 21 cm Tube secured with: Tape Dental Injury: Teeth and Oropharynx as per pre-operative assessment

## 2015-04-28 NOTE — Addendum Note (Signed)
Addendum  created 04/28/15 1308 by Hewitt Blade, CRNA   Modules edited: Clinical Notes   Clinical Notes:  File: [id number]

## 2015-04-29 ENCOUNTER — Encounter (HOSPITAL_COMMUNITY): Payer: Self-pay | Admitting: Obstetrics and Gynecology

## 2015-04-29 LAB — BASIC METABOLIC PANEL
Anion gap: 5 (ref 5–15)
BUN: 8 mg/dL (ref 6–20)
CALCIUM: 8.4 mg/dL — AB (ref 8.9–10.3)
CO2: 28 mmol/L (ref 22–32)
CREATININE: 0.58 mg/dL (ref 0.44–1.00)
Chloride: 104 mmol/L (ref 101–111)
GFR calc non Af Amer: >60 mL/min (ref >60)
Glucose, Bld: 117 mg/dL — ABNORMAL HIGH (ref 65–99)
Potassium: 4.4 mmol/L (ref 3.5–5.1)
SODIUM: 137 mmol/L (ref 135–145)

## 2015-04-29 LAB — CBC
HCT: 31 % — ABNORMAL LOW (ref 36.0–46.0)
Hemoglobin: 10.2 g/dL — ABNORMAL LOW (ref 12.0–15.0)
MCH: 32.5 pg (ref 26.0–34.0)
MCHC: 32.9 g/dL (ref 30.0–36.0)
MCV: 98.7 fL (ref 78.0–100.0)
Platelets: 169 10*3/uL (ref 150–400)
RBC: 3.14 MIL/uL — ABNORMAL LOW (ref 3.87–5.11)
RDW: 12.7 % (ref 11.5–15.5)
WBC: 9.1 10*3/uL (ref 4.0–10.5)

## 2015-04-29 MED ORDER — DIPHENHYDRAMINE HCL 50 MG/ML IJ SOLN
25.0000 mg | Freq: Once | INTRAMUSCULAR | Status: AC
  Administered 2015-04-29: 25 mg via INTRAVENOUS
  Filled 2015-04-29: qty 1

## 2015-04-29 NOTE — Progress Notes (Signed)
1 Day Post-Op Procedure(s) (LRB): BILATERAL SALPINGO OOPHORECTOMY WITH PELVIC WASHINGS  (Bilateral) TOTAL HYSTERECTOMY ABDOMINAL (N/A)  Subjective: Patient reports no problems voiding.   Hungry.  No flatus but is burping.  Ambulating.  Itching from morphine PCA.  Objective: I have reviewed patient's vital signs, intake and output and labs. T max 99.3, T now 99.3 BP 93/54, P 69, RR 18. I/O - [id number] cc/ 2375 cc.  Hgb 10.2  General: alert and cooperative Resp: clear to auscultation bilaterally Cardio: regular rate and rhythm, S1, S2 normal, no murmur, click, rub or gallop GI: soft, non-tender; bowel sounds normal; no masses,  no organomegaly and dressing clean and dry.  Vaginal Bleeding: none  Assessment: s/p Procedure(s): BILATERAL SALPINGO OOPHORECTOMY WITH PELVIC WASHINGS  (Bilateral) TOTAL HYSTERECTOMY ABDOMINAL (N/A): progressing well  Plan: Advance diet Encourage ambulation Advance to PO medication  Anticipate discharge tomorrow am.    LOS: 1 day    [employer] 04/29/2015, 7:57 AM

## 2015-04-30 ENCOUNTER — Telehealth: Payer: Self-pay | Admitting: Obstetrics and Gynecology

## 2015-04-30 MED ORDER — OXYCODONE-ACETAMINOPHEN 5-325 MG PO TABS: 1.0000 | ORAL_TABLET | ORAL | Status: DC | PRN

## 2015-04-30 MED ORDER — IBUPROFEN 600 MG PO TABS: 600.0000 mg | ORAL_TABLET | Freq: Four times a day (QID) | ORAL | Status: DC | PRN

## 2015-04-30 NOTE — Discharge Instructions (Signed)
Abdominal Hysterectomy, Care After °Refer to this sheet in the next few weeks. These instructions provide you with information on caring for yourself after your procedure. Your health care provider may also give you more specific instructions. Your treatment has been planned according to current medical practices, but problems sometimes occur. Call your health care provider if you have any problems or questions after your procedure.  °WHAT TO EXPECT AFTER THE PROCEDURE °After your procedure, it is typical to have the following: °· Pain. °· Feeling tired. °· Poor appetite. °· Less interest in sex. °It takes 4-6 weeks to recover from this surgery.  °HOME CARE INSTRUCTIONS  °· Take pain medicines only as directed by your health care provider. Do not take over-the-counter pain medicines without checking with your health care provider first.  °· Change your bandage as directed by your health care provider. °· Return to your health care provider to have your sutures taken out. °· Take showers instead of baths for 2-3 weeks. Ask your health care provider when it is safe to start showering.  °· Do not douche, use tampons, or have sexual intercourse for at least 6 weeks or until your health care provider says you can.   °· Follow your health care provider's advice about exercise, lifting, driving, and general activities. °· Get plenty of rest and sleep.   °· Do not lift anything heavier than a gallon of milk (about 10 lb [4.5 kg]) for the first month after surgery. °· You can resume your normal diet if your health care provider says it is okay.   °· Do not drink alcohol until your health care provider says you can.   °· If you are constipated, ask your health care provider if you can take a mild laxative. °· Eating foods high in fiber may also help with constipation. Eat plenty of raw fruits and vegetables, whole grains, and beans. °· Drink enough fluids to keep your urine clear or pale yellow.   °· Try to have someone at  home with you for the first 1-2 weeks to help around the house. °· Keep all follow-up appointments. °SEEK MEDICAL CARE IF:  °· You have chills or fever. °· You have swelling, redness, or pain in the area of your incision that is getting worse.   °· You have pus coming from the incision.   °· You notice a bad smell coming from the incision or bandage.   °· Your incision breaks open.   °· You feel dizzy or light-headed.   °· You have pain or bleeding when you urinate.   °· You have persistent diarrhea.   °· You have persistent nausea and vomiting.   °· You have abnormal vaginal discharge.   °· You have a rash.   °· You have any type of abnormal reaction or develop an allergy to your medicine.   °· Your pain medicine is not helping.   °SEEK IMMEDIATE MEDICAL CARE IF:  °· You have a fever and your symptoms suddenly get worse. °· You have severe abdominal pain. °· You have chest pain. °· You have shortness of breath. °· You faint. °· You have pain, swelling, or redness of your leg. °· You have heavy vaginal bleeding with blood clots. °MAKE SURE YOU: °· Understand these instructions. °· Will watch your condition. °· Will get help right away if you are not doing well or get worse. °  °This information is not intended to replace advice given to you by your health care provider. Make sure you discuss any questions you have with your health care provider. °  °Document   Released: 09/03/2004 Document Revised: 03/07/2014 Document Reviewed: 12/07/2012 °Elsevier Interactive Patient Education ©2016 [employer] ° °

## 2015-04-30 NOTE — Telephone Encounter (Signed)
Phone call to patient with surgical pathology results.  Left ovary with mature dermoid cyst.   Bilateral paratubal cysts.  Benign endometrial polyp.  Benign cervix and uterus.  Benign right ovary.   Patient will follow up next week for her first post op check.

## 2015-04-30 NOTE — Progress Notes (Signed)
Pt is discharged in the care of husband withhusband escort. Denies pain or discomfort.. No equipment needed for home use.Abdominal dressing is clean and dry. Pt is stable.

## 2015-04-30 NOTE — Progress Notes (Signed)
2 Days Post-Op Procedure(s) (LRB): BILATERAL SALPINGO OOPHORECTOMY WITH PELVIC WASHINGS  (Bilateral) TOTAL HYSTERECTOMY ABDOMINAL (N/A)  Subjective: Patient reports tolerating PO and + flatus.    Objective: I have reviewed patient's vital signs and intake and output. T mas 99.6 - T now 98.4, BP 108/64, P 71, RR 16 General: alert and cooperative Resp: clear to auscultation bilaterally Cardio: regular rate and rhythm, S1, S2 normal, no murmur, click, rub or gallop GI: soft, non-tender; bowel sounds normal; no masses,  no organomegaly and incision: clean, dry and intact Vaginal Bleeding: none  Assessment: s/p Procedure(s): BILATERAL SALPINGO OOPHORECTOMY WITH PELVIC WASHINGS  (Bilateral) TOTAL HYSTERECTOMY ABDOMINAL (N/A): progressing well and ready for discharge  Plan: Discharge home  Instructions and precautions given in verbal and written form.  Rx for Percocet and Motrin.  No HRT for the first 10 - 14 days post op.  Follow up in 5 days.   LOS: 2 days    Arloa Koh 04/30/2015, 8:04 AM

## 2015-05-04 ENCOUNTER — Ambulatory Visit (INDEPENDENT_AMBULATORY_CARE_PROVIDER_SITE_OTHER): Payer: Commercial Managed Care - PPO | Admitting: Obstetrics and Gynecology

## 2015-05-04 ENCOUNTER — Encounter: Payer: Self-pay | Admitting: Obstetrics and Gynecology

## 2015-05-04 VITALS — BP 138/62 | HR 76 | Ht 63.0 in | Wt 139.4 lb

## 2015-05-04 DIAGNOSIS — Z9889 Other specified postprocedural states: Secondary | ICD-10-CM

## 2015-05-04 DIAGNOSIS — D649 Anemia, unspecified: Secondary | ICD-10-CM

## 2015-05-04 LAB — CBC
HEMATOCRIT: 37.4 % (ref 36.0–46.0)
HEMOGLOBIN: 12.4 g/dL (ref 12.0–15.0)
MCH: 32 pg (ref 26.0–34.0)
MCHC: 33.2 g/dL (ref 30.0–36.0)
MCV: 96.4 fL (ref 78.0–100.0)
MPV: 10.4 fL (ref 8.6–12.4)
Platelets: 298 10*3/uL (ref 150–400)
RBC: 3.88 MIL/uL (ref 3.87–5.11)
RDW: 12.4 % (ref 11.5–15.5)
WBC: 6 10*3/uL (ref 4.0–10.5)

## 2015-05-04 NOTE — Progress Notes (Signed)
Patient ID: [name], female   DOB: 06/30/1955, 60 y.o.   MRN: [id number] GYNECOLOGY  VISIT   HPI: 60 y.o.   Married  Caucasian  female   G1P0000 with Patient's last menstrual period was 02/28/2009 (approximate).   here for 1 week follow up [place] (Bilateral ) [DQQ22 CPT (R)]  TOTAL HYSTERECTOMY ABDOMINAL (N/A ).  Developed rash and itching with Percocet and stopped this. Taking Motrin 4 times per day.  Having normal bowel and bladder function.  Feels abdomen is puffy.   Wearing Ted hose at night.  Ambulating during the day.   GYNECOLOGIC HISTORY: Patient's last menstrual period was 02/28/2009 (approximate). Contraception:Postmenopausal/TAH/BSO Menopausal hormone therapy: None Last mammogram: 08-12-14 Density Cat.B/Neg/BiRads1:[facility] Last pap smear: 02-16-15 Neg        OB History    Gravida Para Term Preterm AB TAB SAB Ectopic Multiple Living   1 0 0  0     0         Patient Active Problem List   Diagnosis Date Noted  . Status post total abdominal hysterectomy and bilateral salpingo-oophorectomy 04/28/2015  . Dermoid cyst 04/06/2015    Past Medical History  Diagnosis Date  . Osteoarthritis   . Pneumonia     Past Surgical History  Procedure Laterality Date  . Toe surgery Right 20 years ago    right small toe  . Rectal nodule removal  05/2012    --benign with Dr. Elmo Putt Thomas([facility])  . Wisdom tooth extraction    . Salpingoophorectomy Bilateral 04/28/2015    Procedure: BILATERAL SALPINGO OOPHORECTOMY WITH PELVIC WASHINGS ;  Surgeon: Nunzio Cobbs, MD;  Location: [facility];  Service: Gynecology;  Laterality: Bilateral;  . Abdominal hysterectomy N/A 04/28/2015    Procedure: TOTAL HYSTERECTOMY ABDOMINAL;  Surgeon: Nunzio Cobbs, MD;  Location: [facility];  Service: Gynecology;  Laterality: N/A;    Current Outpatient Prescriptions  Medication Sig Dispense Refill  . calcium-vitamin D (CALCIUM 500/D)  500-200 MG-UNIT tablet Take 1 tablet by mouth daily with breakfast.     . fish oil-omega-3 fatty acids 1000 MG capsule Take 1 g by mouth daily.    Marland Kitchen GLUCOSAMINE CHONDROITIN COMPLX PO Take 2 capsules by mouth daily.     Marland Kitchen ibuprofen (ADVIL,MOTRIN) 600 MG tablet Take 1 tablet (600 mg total) by mouth every 6 (six) hours as needed (mild pain). 30 tablet 0  . Multiple Vitamin (MULTIVITAMIN) tablet Take 1 tablet by mouth daily.    Marland Kitchen JEVANTIQUE LO 0.5-2.5 MG-MCG tablet Reported on 05/04/2015     No current facility-administered medications for this visit.     ALLERGIES: Percocet  Family History  Problem Relation Age of Onset  . Cancer Maternal Grandmother 9    Colon Ca--Dec age 52 from age related problems  . Asthma Maternal Grandmother   . Hypertension Mother   . Thyroid disease Mother     parathyroid Dz  . Hyperlipidemia Brother     Social History   Social History  . Marital Status: Married    Spouse Name: N/A  . Number of Children: N/A  . Years of Education: N/A   Occupational History  . Not on file.   Social History Main Topics  . Smoking status: Former Smoker    Quit date: 02/28/1994  . Smokeless tobacco: Never Used  . Alcohol Use: 4.2 - 6.0 oz/week    7-10 Standard drinks or equivalent per week     Comment:  1-2 glasses of wine per day  . Drug Use: No  . Sexual Activity:    Partners: Male    Birth Control/ Protection: Post-menopausal, Surgical     Comment: TAH/BSO   Other Topics Concern  . Not on file   Social History Narrative    ROS:  Pertinent items are noted in HPI.  PHYSICAL EXAMINATION:    BP 138/62 mmHg  Pulse 76  Ht '5\' 3"'$  (1.6 m)  Wt 139 lb 6.4 oz (63.231 kg)  BMI 24.70 kg/m2  LMP 02/28/2009 (Approximate)    General appearance: alert, cooperative and appears stated age   Abdomen: Incision intact with steristrips on, minor bruising of midline incision, no induration, soft, non-tender; no masses,  no organomegaly   Skin: Skin color, texture, turgor  normal.  Excoriation of skin on lower back.    Chaperone was present for exam.  ASSESSMENT  Status post TAH/BSO/colleciton of pelvic washings for benign left dermoid cyst.  Allergy to Percocet. Anemia post op.  PLAN  Avoid Percocet.  Discussed importance of movement post op to avoid DVT/PE. Check CBC today.  Follow up for 6 week post op and prn.   An After Visit Summary was printed and given to the patient.

## 2015-05-04 NOTE — Discharge Summary (Signed)
Physician Discharge Summary  Patient ID: [name] MRN: [id number] DOB/AGE: Jan 03, 1956 60 y.o.  Admit date: 04/28/2015 Discharge date: 04/30/15 Admission Diagnoses: Pelvic mass, suspected dermoid cyst.  Discharge Diagnoses:  1.  Pelvic mass, suspected dermoid cyst. 2.  Status post total abdominal hysterectomy with bilateral salpingo-oophorectomy and collection of pelvic washings. Active Problems:   Status post total abdominal hysterectomy and bilateral salpingo-oophorectomy   Discharged Condition: good  Hospital Course:  The patient was admitted on 04/28/15 for a total abdominal hysterectomy with bilateral salpingo-oophorectomy and collection of pelvic washings which were performed without complication while under general anesthesia.  The patient's post op course was uneventful.  She had a morphine PCA and Toradol for pain control initially, and this was converted over to Percocet and Motrin on post op day one when the patient began taking po well.  She ambulated independently and wore PAS and Ted hose for DVT prophylaxis while in bed.  Her foley catheter were removed on post op day one, and she voided good volumes. The patient's vital signs remained stable and she demonstrated no signs of infection during her hospitalization.  The patient's post op day one Hgb was 10.2.   She was tolerating the this well.  She had very minimal vaginal bleeding, and her incision(s) demonstrated no signs of erythema or significant drainage.  She was found to be in good condition and ready for discharge on post op day two.  Final pathology report is pending at the time of discharge.  Consults: None  Significant Diagnostic Studies: labs:  Please refer to Hospital Course above.  Treatments: surgery:  total abdominal hysterectomy with bilateral salpingo-oophorectomy and collection of pelvic washing on 04/28/15.   Discharge Exam: Blood pressure 108/64, pulse 71, temperature 98.4 F (36.9 C), temperature  source Oral, resp. rate 16, height '5\' 3"'$  (1.6 m), weight 138 lb (62.596 kg), last menstrual period 02/28/2009, SpO2 100 %. General: alert and cooperative Resp: clear to auscultation bilaterally Cardio: regular rate and rhythm, S1, S2 normal, no murmur, click, rub or gallop GI: soft, non-tender; bowel sounds normal; no masses, no organomegaly and incision: clean, dry and intact Vaginal Bleeding: none  Disposition: 01-Home or Self Care  Discharge instructions were reviewed in verbal and written form.    Medication List    STOP taking these medications        norethindrone-ethinyl estradiol 0.5-2.5 MG-MCG tablet  Commonly known as:  FEMHRT LOW DOSE      TAKE these medications        CALCIUM 500/D 500-200 MG-UNIT tablet  Generic drug:  calcium-vitamin D  Take 1 tablet by mouth daily with breakfast.     fish oil-omega-3 fatty acids 1000 MG capsule  Take 1 g by mouth daily.     GLUCOSAMINE CHONDROITIN COMPLX PO  Take 2 capsules by mouth daily.     ibuprofen 600 MG tablet  Commonly known as:  ADVIL,MOTRIN  Take 1 tablet (600 mg total) by mouth every 6 (six) hours as needed (mild pain).     multivitamin tablet  Take 1 tablet by mouth daily.     oxyCODONE-acetaminophen 5-325 MG tablet  Commonly known as:  PERCOCET/ROXICET  Take 1-2 tablets by mouth every 4 (four) hours as needed for severe pain (moderate to severe pain (when tolerating fluids)).           Follow-up Information    Follow up with Arloa Koh, MD On 05/04/2015.   Specialty:  Obstetrics and Gynecology   Contact  information:   [address] [place] [address] [phone]       Signed: Arloa Koh 05/04/2015, 8:24 AM

## 2015-05-05 ENCOUNTER — Telehealth: Payer: Self-pay

## 2015-05-05 NOTE — Telephone Encounter (Signed)
-----   Message from Nunzio Cobbs, MD sent at 05/05/2015  7:27 AM EST ----- Please report normal CBC to patient.  I recommend she just take her multivitamin daily right now.  She does not need additional iron.   Cc- Marisa Sprinkles

## 2015-05-05 NOTE — Telephone Encounter (Signed)
Spoke with patient. Advised of result as seen below form Dr.Silva. She is agreeable and verbalizes understanding. Patient states she was unable to take pain medication post-operatively due to allergies. Reports she has been taking Ibuprofen 600 mg every 6 hours as needed. "I have almost needed it every 6 hours. I have a couple of days left of the prescription and I am not sure if I will need any more after it runs out, but would like to have it just in case." Advised I will speak with Dr.Silva regarding medication refill and return call with further recommendations. She is agreeable.

## 2015-05-06 MED ORDER — IBUPROFEN 600 MG PO TABS: 600.0000 mg | ORAL_TABLET | Freq: Four times a day (QID) | ORAL | Status: DC | PRN

## 2015-05-06 NOTE — Telephone Encounter (Signed)
Rx for Ibuprofen 600 mg po q 6 hours #30 1RF sent to the [facility] on file. Spoke with patient who is agreeable and verbalizes understanding.  Routing to provider for final review. Patient agreeable to disposition. Will close encounter.

## 2015-05-06 NOTE — Telephone Encounter (Signed)
OK for refills of Ibuprofen 600 mg po q 6 hours prn.  #30, RF one.

## 2015-05-29 ENCOUNTER — Telehealth: Payer: Self-pay | Admitting: Obstetrics and Gynecology

## 2015-05-29 NOTE — Telephone Encounter (Signed)
Patient says she need a work note saying it's ok for her to return to work on 06/08/15. Note can be faxed to [phone].

## 2015-06-01 NOTE — Telephone Encounter (Signed)
Call to patient. She has post op appointment 06/10/15 and per [facility] paperwork she is to return to work on 06/09/15.  Moved patient appointment to 06/08/15 so that Dr. Quincy Simmonds can evaluate her before return to work note is written.  Patient is agreeable to plan.  Routing to provider for final review. Patient agreeable to disposition. Will close encounter.

## 2015-06-08 ENCOUNTER — Encounter: Payer: Self-pay | Admitting: Obstetrics and Gynecology

## 2015-06-08 ENCOUNTER — Ambulatory Visit (INDEPENDENT_AMBULATORY_CARE_PROVIDER_SITE_OTHER): Payer: Commercial Managed Care - PPO | Admitting: Obstetrics and Gynecology

## 2015-06-08 VITALS — BP 110/68 | HR 84 | Ht 63.0 in | Wt 139.0 lb

## 2015-06-08 DIAGNOSIS — Z90722 Acquired absence of ovaries, bilateral: Secondary | ICD-10-CM

## 2015-06-08 DIAGNOSIS — Z9079 Acquired absence of other genital organ(s): Secondary | ICD-10-CM

## 2015-06-08 DIAGNOSIS — Z90710 Acquired absence of both cervix and uterus: Secondary | ICD-10-CM

## 2015-06-08 NOTE — Progress Notes (Signed)
Patient ID: [name], female   DOB: Mar 25, 1955, 60 y.o.   MRN: [id number] GYNECOLOGY  VISIT   HPI: 60 y.o.   Married  Caucasian  female   G1P0000 with Patient's last menstrual period was 02/28/2009 (approximate).   here for 6 week follow up  [place] (Bilateral ) [IWL79 CPT (R)]  TOTAL HYSTERECTOMY ABDOMINAL (N/A ).   Final pathology - benign dermoid cyst of left ovary, benign uterus and cervix.  Pelvic washings - benign.   Normal bladder and bowel function.  No vaginal bleeding.  No pain medication use.  Feels great overall.  No concerns other than some puffiness of the abdomen, which has already improved significantly.   GYNECOLOGIC HISTORY: Patient's last menstrual period was 02/28/2009 (approximate). Contraception:  Postmenopausal/TAH/BSO Menopausal hormone therapy:  None Last mammogram:  08-12-14 D[facility] Cat.B/Neg/BiRads1:[facility]. Last pap smear:   02-16-15 Neg        OB History    Gravida Para Term Preterm AB TAB SAB Ectopic Multiple Living   1 0 0  0     0         Patient Active Problem List   Diagnosis Date Noted  . Status post total abdominal hysterectomy and bilateral salpingo-oophorectomy 04/28/2015  . Dermoid cyst 04/06/2015    Past Medical History  Diagnosis Date  . Osteoarthritis   . Pneumonia     Past Surgical History  Procedure Laterality Date  . Toe surgery Right 20 years ago    right small toe  . Rectal nodule removal  05/2012    --benign with Dr. Elmo Putt Thomas([facility])  . Wisdom tooth extraction    . Salpingoophorectomy Bilateral 04/28/2015    Procedure: BILATERAL SALPINGO OOPHORECTOMY WITH PELVIC WASHINGS ;  Surgeon: Nunzio Cobbs, MD;  Location: [facility];  Service: Gynecology;  Laterality: Bilateral;  . Abdominal hysterectomy N/A 04/28/2015    Procedure: TOTAL HYSTERECTOMY ABDOMINAL;  Surgeon: Nunzio Cobbs, MD;  Location: [facility];  Service: Gynecology;  Laterality: N/A;     Current Outpatient Prescriptions  Medication Sig Dispense Refill  . calcium-vitamin D (CALCIUM 500/D) 500-200 MG-UNIT tablet Take 1 tablet by mouth daily with breakfast.     . fish oil-omega-3 fatty acids 1000 MG capsule Take 1 g by mouth daily.    Marland Kitchen GLUCOSAMINE CHONDROITIN COMPLX PO Take 2 capsules by mouth daily.     Marland Kitchen JEVANTIQUE LO 0.5-2.5 MG-MCG tablet Reported on 05/04/2015    . Multiple Vitamin (MULTIVITAMIN) tablet Take 1 tablet by mouth daily.     No current facility-administered medications for this visit.     ALLERGIES: Percocet  Family History  Problem Relation Age of Onset  . Cancer Maternal Grandmother 39    Colon Ca--Dec age 74 from age related problems  . Asthma Maternal Grandmother   . Hypertension Mother   . Thyroid disease Mother     parathyroid Dz  . Hyperlipidemia Brother     Social History   Social History  . Marital Status: Married    Spouse Name: N/A  . Number of Children: N/A  . Years of Education: N/A   Occupational History  . Not on file.   Social History Main Topics  . Smoking status: Former Smoker    Quit date: 02/28/1994  . Smokeless tobacco: Never Used  . Alcohol Use: 4.2 - 6.0 oz/week    7-10 Standard drinks or equivalent per week     Comment: 1-2 glasses of wine  per day  . Drug Use: No  . Sexual Activity:    Partners: Male    Birth Control/ Protection: Post-menopausal, Surgical     Comment: TAH/BSO   Other Topics Concern  . Not on file   Social History Narrative    ROS:  Pertinent items are noted in HPI.  PHYSICAL EXAMINATION:    BP 110/68 mmHg  Pulse 84  Ht '5\' 3"'$  (1.6 m)  Wt 139 lb (63.05 kg)  BMI 24.63 kg/m2  LMP 02/28/2009 (Approximate)    General appearance: alert, cooperative and appears stated age   Abdomen: incision(s):Yes.  , __Pfannensteil incision intact and well healed.  No induration or masses. Abdomen soft, non-tender, no masses,  no organomegaly    Pelvic: External genitalia:  no lesions               Urethra:  normal appearing urethra with no masses, tenderness or lesions              Bartholins and Skenes: normal                 Vagina: normal appearing vagina with normal color and discharge, no lesions.  No visible suture.  Palpable suture line - intact.  Minor, minor blood staining from digital exam.              Cervix: absent   Bimanual Exam:  Uterus:  uterus absent              Adnexa: no mass, fullness, tenderness            Chaperone was present for exam.  ASSESSMENT  Status post TAH/BSO/collection of pelvic washings for benign dermoid cyst of left ovary.   PLAN  Review of surgical care with patient.  OK for return to work at 6 weeks mark. Letter written.  No sexual activity or heavy exercise for one more week.  Return to regular GYN yearly care.  Follow up prn.    An After Visit Summary was printed and given to the patient.  _15_____ minutes face to face time of which over 50% was spent in counseling.

## 2015-06-10 ENCOUNTER — Ambulatory Visit: Payer: Commercial Managed Care - PPO | Admitting: Obstetrics and Gynecology

## 2016-08-12 ENCOUNTER — Ambulatory Visit (INDEPENDENT_AMBULATORY_CARE_PROVIDER_SITE_OTHER): Payer: Commercial Managed Care - PPO

## 2016-08-12 ENCOUNTER — Ambulatory Visit (INDEPENDENT_AMBULATORY_CARE_PROVIDER_SITE_OTHER): Payer: Commercial Managed Care - PPO | Admitting: Podiatry

## 2016-08-12 ENCOUNTER — Encounter: Payer: Self-pay | Admitting: Podiatry

## 2016-08-12 ENCOUNTER — Telehealth: Payer: Self-pay | Admitting: *Deleted

## 2016-08-12 DIAGNOSIS — M79675 Pain in left toe(s): Secondary | ICD-10-CM | POA: Diagnosis not present

## 2016-08-12 DIAGNOSIS — M2040 Other hammer toe(s) (acquired), unspecified foot: Secondary | ICD-10-CM | POA: Diagnosis not present

## 2016-08-12 DIAGNOSIS — M79674 Pain in right toe(s): Secondary | ICD-10-CM | POA: Diagnosis not present

## 2016-08-12 DIAGNOSIS — M21619 Bunion of unspecified foot: Secondary | ICD-10-CM | POA: Diagnosis not present

## 2016-08-12 NOTE — Progress Notes (Signed)
Subjective:    Patient ID: [name], female   DOB: 61 y.o.   MRN: [id number]   HPI patient presents stating I'm ready to get my bunion fixed and my toes seem to be bothering but it still seems to be mostly the bunion    ROS      Objective:  Physical Exam neurovascular status intact with no change in health history with patient found have large structural bunion deformity left over right foot with redness and pain around the joint surface and inability to wear shoe gear comfortably. 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 I've recommended structural bunion correction explaining procedure and allow patient to go over consent form going over all risk associated with procedure. I reviewed with her alternative treatments complications and the fact recovery can take 6 months to one year for complete recovery. I dispensed air fracture walker with all instructions on usage and patient is scheduled for outpatient surgery again with strong encouragement to call with any questions that she may have.

## 2016-08-12 NOTE — Telephone Encounter (Signed)
"  I saw Dr. Paulla Dolly today and they gave me a few dates to schedule surgery.  I have chosen July 3.  They told me to call you to schedule a date."

## 2016-08-12 NOTE — Patient Instructions (Signed)

## 2016-08-15 NOTE — Telephone Encounter (Signed)
"  I left you a message on Friday."  Yes, you want to schedule surgery for July 3.  I will get it scheduled.  "Okay, thank you very much."

## 2016-08-25 ENCOUNTER — Telehealth: Payer: Self-pay | Admitting: Podiatry

## 2016-08-25 NOTE — Telephone Encounter (Addendum)
Left message for pt to bring surgical boot to the office and ask for me or Caryl Pina and we would check the fit.08/26/2016-Pt presented to office for fitting of surgical boot. Pt was given a medium surgical boot at last visit. I fitted pt with a small surgical boot and gave application instructions.

## 2016-08-25 NOTE — Telephone Encounter (Signed)
Hello, this is [name] and I'm scheduled to have foot surgery this coming Tuesday 03 July. I was given a boot to bring with me to the surgical center to wear after the surgery and I tried it on at home. It feels like its too big for my foot. If you would, please call me back at [phone]. Thank you.

## 2016-08-30 ENCOUNTER — Encounter: Payer: Self-pay | Admitting: Podiatry

## 2016-08-30 DIAGNOSIS — M2012 Hallux valgus (acquired), left foot: Secondary | ICD-10-CM | POA: Diagnosis not present

## 2016-08-30 DIAGNOSIS — M25774 Osteophyte, right foot: Secondary | ICD-10-CM | POA: Diagnosis not present

## 2016-08-30 DIAGNOSIS — M2042 Other hammer toe(s) (acquired), left foot: Secondary | ICD-10-CM | POA: Diagnosis not present

## 2016-09-02 NOTE — Progress Notes (Signed)
DOS 07.03.2018   1. [place] Bunionectomy (cutting and moving bone) with pin fixation left.   2. Possible aiken osteotomy with wire left.   3. Hammertoe repair with removal bone 3,4 left.   4. Exostosis 2nd left.

## 2016-09-07 ENCOUNTER — Encounter: Payer: Self-pay | Admitting: Podiatry

## 2016-09-07 ENCOUNTER — Ambulatory Visit (INDEPENDENT_AMBULATORY_CARE_PROVIDER_SITE_OTHER): Payer: Commercial Managed Care - PPO

## 2016-09-07 ENCOUNTER — Ambulatory Visit (INDEPENDENT_AMBULATORY_CARE_PROVIDER_SITE_OTHER): Payer: Commercial Managed Care - PPO | Admitting: Podiatry

## 2016-09-07 VITALS — BP 110/66 | HR 87 | Temp 98.0°F | Resp 16

## 2016-09-07 DIAGNOSIS — M2042 Other hammer toe(s) (acquired), left foot: Secondary | ICD-10-CM

## 2016-09-07 DIAGNOSIS — M2040 Other hammer toe(s) (acquired), unspecified foot: Secondary | ICD-10-CM

## 2016-09-07 DIAGNOSIS — M21619 Bunion of unspecified foot: Secondary | ICD-10-CM

## 2016-09-07 NOTE — Progress Notes (Signed)
Subjective:    Patient ID: [name], female   DOB: 61 y.o.   MRN: [id number]   HPI patient states she's doing very well with the surgical correction of her left foot    ROS      Objective:  Physical Exam neurovascular status intact negative Homan sign was noted with patient's left foot doing well with patient's left first MPJ and excellent alignment wound edges well coapted and lesser digits and good alignment. Patient's found to have no drainage noted with good wound apposition     Assessment:    Doing well post osteotomy first metatarsal left and lesser digits left     Plan:    H&P conditions reviewed sterile dressing reapplied instructed on continued elevation immobilization compression and reappoint in 2 weeks for suture removal or earlier if needed  X-rays indicate osteotomy is healing well pins are in place joint is stable with excellent reduction of deformity

## 2016-09-21 ENCOUNTER — Encounter: Payer: Self-pay | Admitting: Podiatry

## 2016-09-21 ENCOUNTER — Ambulatory Visit (INDEPENDENT_AMBULATORY_CARE_PROVIDER_SITE_OTHER): Payer: Commercial Managed Care - PPO

## 2016-09-21 ENCOUNTER — Ambulatory Visit (INDEPENDENT_AMBULATORY_CARE_PROVIDER_SITE_OTHER): Payer: Commercial Managed Care - PPO | Admitting: Podiatry

## 2016-09-21 VITALS — BP 122/72 | HR 82

## 2016-09-21 DIAGNOSIS — M2042 Other hammer toe(s) (acquired), left foot: Secondary | ICD-10-CM

## 2016-09-21 DIAGNOSIS — M21619 Bunion of unspecified foot: Secondary | ICD-10-CM

## 2016-09-21 NOTE — Progress Notes (Signed)
Subjective:    Patient ID: [name], female   DOB: 61 y.o.   MRN: [id number]   HPI patient states she's doing well with her left foot    ROS      Objective:  Physical Exam neurovascular status intact with patient's left foot doing well with good alignment noted stitches intact second third and fourth digit     Assessment:    Doing well post surgery left     Plan:    X-rays reviewed and stitches removed with wound edges well coapted left and dispensed ankle compression stocking with continued elevation immobilization for the next several weeks. Reappoint to recheck 3 weeks or earlier if needed  X-rays indicate that the osteotomies are healing well with wire pins in place with good alignment noted

## 2016-10-12 ENCOUNTER — Ambulatory Visit (INDEPENDENT_AMBULATORY_CARE_PROVIDER_SITE_OTHER): Payer: Commercial Managed Care - PPO

## 2016-10-12 ENCOUNTER — Ambulatory Visit (INDEPENDENT_AMBULATORY_CARE_PROVIDER_SITE_OTHER): Payer: Self-pay | Admitting: Podiatry

## 2016-10-12 ENCOUNTER — Encounter: Payer: Self-pay | Admitting: Podiatry

## 2016-10-12 DIAGNOSIS — M2042 Other hammer toe(s) (acquired), left foot: Secondary | ICD-10-CM | POA: Diagnosis not present

## 2016-10-12 DIAGNOSIS — M21619 Bunion of unspecified foot: Secondary | ICD-10-CM

## 2016-10-13 NOTE — Progress Notes (Signed)
Subjective:    Patient ID: [name], female   DOB: 61 y.o.   MRN: [id number]   HPI patient states doing very well with the left foot and would like to get the right foot fixed    ROS      Objective:  Physical Exam neurovascular status intact with excellent alignment of the first MPJ left hallux and digits with good positional component and good range of motion of the first MPJ was structural bunion deformity right deviation the hallux and exostotic lesion on the second digit right     Assessment:    Doing well with surgery left with symptoms noted right     Plan:    H&P and x-rays of both feet reviewed with patient. Left foot doing very well to gradually return to normal shoes and for the right I have recommended Austin-type osteotomy probable Akin osteotomy and distal exostectomy. Patient wants surgery and at this point I allowed her to read consent form going over what would be done and all possible complications and just because 1 foot did so well there is no guarantee on the second foot. Patient's willing to accept risk of surgery signed consent form after extensive review and is tenably scheduled for September for her procedure depending on her work status

## 2016-10-19 ENCOUNTER — Telehealth: Payer: Self-pay | Admitting: *Deleted

## 2016-10-19 NOTE — Telephone Encounter (Signed)
"  I left you a message the other day and I haven't received a call back."  I apologize for not calling you back yet.  You wanted to schedule surgery for October 30?  "Yes, that is correct."  October 30 is available.  I will get it scheduled.  You will hear from someone from the surgical center the Friday or Monday prior to surgery date about the arrival time.

## 2016-10-19 NOTE — Telephone Encounter (Signed)
"  I was given a couple of choices for surgery with Dr. Paulla Dolly.  I would like to schedule it for October 30.  Thanks Hershall Benkert, I'll talk to you later."

## 2016-11-16 ENCOUNTER — Telehealth: Payer: Self-pay | Admitting: *Deleted

## 2016-11-16 NOTE — Telephone Encounter (Signed)
"  I have surgery scheduled for October 30 with Dr. Paulla Dolly.  I was wondering if there's any possible way I could change that to October 9.  If you'd let me know I'd really appreciate it."

## 2016-11-17 NOTE — Telephone Encounter (Signed)
I'm returning your call.  I will reschedule your surgery from October 30 to October 9.  "That will be great because they had me scheduled to work the first week of November.  Thank you so much."

## 2016-12-06 ENCOUNTER — Encounter: Payer: Self-pay | Admitting: Podiatry

## 2016-12-06 DIAGNOSIS — M2041 Other hammer toe(s) (acquired), right foot: Secondary | ICD-10-CM | POA: Diagnosis not present

## 2016-12-06 DIAGNOSIS — M2011 Hallux valgus (acquired), right foot: Secondary | ICD-10-CM | POA: Diagnosis not present

## 2016-12-06 DIAGNOSIS — M25774 Osteophyte, right foot: Secondary | ICD-10-CM | POA: Diagnosis not present

## 2016-12-13 NOTE — Progress Notes (Signed)
DOS 12/06/2016 Aiken Osteotomy RT; Austin Bunionectomy RT; Exostectomy 2nd RT

## 2016-12-14 ENCOUNTER — Ambulatory Visit (INDEPENDENT_AMBULATORY_CARE_PROVIDER_SITE_OTHER): Payer: Self-pay | Admitting: Podiatry

## 2016-12-14 ENCOUNTER — Ambulatory Visit (INDEPENDENT_AMBULATORY_CARE_PROVIDER_SITE_OTHER): Payer: Commercial Managed Care - PPO

## 2016-12-14 DIAGNOSIS — M21619 Bunion of unspecified foot: Secondary | ICD-10-CM | POA: Diagnosis not present

## 2016-12-14 DIAGNOSIS — Z9889 Other specified postprocedural states: Secondary | ICD-10-CM

## 2016-12-18 NOTE — Progress Notes (Signed)
   Subjective:  Patient presents today status post right foot surgery. DOS: 12/06/16. Patient states she is doing well. She denies any pain. She denies any new complaints at this time.    Past Medical History:  Diagnosis Date  . Osteoarthritis   . Pneumonia       Objective/Physical Exam Skin incisions appear to be well coapted with sutures and staples intact. No sign of infectious process noted. No dehiscence. No active bleeding noted. Moderate edema noted to the surgical extremity.  Radiographic Exam:  Orthopedic hardware and osteotomies sites appear to be stable with routine healing.  Assessment: 1. s/p Right foot surgery. DOS: 12/06/16.   Plan of Care:  1. Patient was evaluated. X-rays reviewed 2. Dressing change. 3. Continue weightbearing in cam boot. 4. Return to clinic in 1 week for suture removal.   Edrick Kins, DPM [facility]  Dr. Edrick Kins, [address]                                        [place], [place] [address]                Office [phone]  Fax [phone]

## 2016-12-28 ENCOUNTER — Ambulatory Visit (INDEPENDENT_AMBULATORY_CARE_PROVIDER_SITE_OTHER): Payer: Commercial Managed Care - PPO

## 2016-12-28 ENCOUNTER — Ambulatory Visit (INDEPENDENT_AMBULATORY_CARE_PROVIDER_SITE_OTHER): Payer: Commercial Managed Care - PPO | Admitting: Podiatry

## 2016-12-28 DIAGNOSIS — Z9889 Other specified postprocedural states: Secondary | ICD-10-CM

## 2016-12-28 DIAGNOSIS — M21619 Bunion of unspecified foot: Secondary | ICD-10-CM | POA: Diagnosis not present

## 2016-12-28 NOTE — Progress Notes (Signed)
Subjective:    Patient ID: [name], female   DOB: 61 y.o.   MRN: [id number]   HPI patient states she's doing well with minimal discomfort and does admit that at home she's been walking on her foot without her boot    ROS      Objective:  Physical Exam neurovascular status intact negative Homans sign was noted with patient's right foot healing well clinically but there is some stress on it secondary to activity levels with wound edges well coapted and no drainage     Assessment:   Doing well overall with some stress on the big toe secondary to activity level      Plan:    Reviewed x-ray indicated the osteotomy is healing well but there is some stress on the big toe where the hallux osteotomy was done and I want the patient to be careful about not bearing weight on this without the boot  X-ray indicated there is some stress on the Akin osteotomy site with fixation in place with slight dorsal excursion secondary to activity levels which will be watched over the next few weeks

## 2017-01-25 ENCOUNTER — Other Ambulatory Visit: Payer: Commercial Managed Care - PPO

## 2017-02-01 ENCOUNTER — Ambulatory Visit (INDEPENDENT_AMBULATORY_CARE_PROVIDER_SITE_OTHER): Payer: Commercial Managed Care - PPO

## 2017-02-01 ENCOUNTER — Ambulatory Visit (INDEPENDENT_AMBULATORY_CARE_PROVIDER_SITE_OTHER): Payer: Commercial Managed Care - PPO | Admitting: Podiatry

## 2017-02-01 ENCOUNTER — Encounter: Payer: Self-pay | Admitting: Podiatry

## 2017-02-01 ENCOUNTER — Other Ambulatory Visit: Payer: Self-pay | Admitting: Podiatry

## 2017-02-01 VITALS — BP 118/68 | HR 76 | Resp 16

## 2017-02-01 DIAGNOSIS — M2011 Hallux valgus (acquired), right foot: Secondary | ICD-10-CM | POA: Diagnosis not present

## 2017-02-01 DIAGNOSIS — M21619 Bunion of unspecified foot: Secondary | ICD-10-CM

## 2017-02-01 NOTE — Progress Notes (Signed)
Subjective:   Patient ID: [name], female   DOB: 61 y.o.   MRN: [id number]   HPI Patient presents stating I am doing well with minimal discomfort and I am back to being very active wearing normal shoes and I have been going to the gym but not jumping on my foot   ROS      Objective:  Physical Exam  Neurovascular status intact muscle strength adequate range of motion within normal limits with patient's right foot healing well with wound edges well coapted hallux rectus position mild hallux swelling but good function of the joint and good function of the extensor and the flexor tendon     Assessment:  Doing well post osteotomy right first metatarsal right hallux with good alignment noted with mild edema of the toe secondary to previous trauma     Plan:  H&P x-rays reviewed and discussed that clinically she is doing very well I still want her to be careful about any kind of aggressive activity.  Patient will be seen back to recheck again in the next several months and is encouraged to call with any questions and will gradually increase activity but in a very careful fashion.  X-rays indicate the osteotomy is healing well with very slight secondary healing at the metatarsal site and stress on the [facility] osteotomy site but appears to be healing well in this position with healing still to commence due to patient having been quite active on the foot and not wearing proper immobilization during the postoperative.  This should heal uneventfully and not cause her long-term

## 2019-08-08 ENCOUNTER — Telehealth: Payer: Self-pay | Admitting: Gastroenterology

## 2019-08-08 NOTE — Telephone Encounter (Signed)
Recv'd records from [facility](Dr. Outlaw)forwarded 7 pages to Dr. Alvera Novel) 08/08/19 fbg

## 2019-08-09 ENCOUNTER — Telehealth: Payer: Self-pay | Admitting: Gastroenterology

## 2019-08-09 NOTE — Telephone Encounter (Signed)
Hi Dr. Silverio Decamp,  We received records from Dr. Erlinda Hong office. Patient is requesting to continue care with you. Sending record for you to review.  Please advise on scheduling. Thank you

## 2019-09-18 ENCOUNTER — Encounter: Payer: Self-pay | Admitting: Gastroenterology

## 2019-09-25 NOTE — Telephone Encounter (Signed)
Can you double check if they are still downstairs, I cant find them up here  Thank You

## 2019-09-25 NOTE — Telephone Encounter (Signed)
They should have gone back up on 09/18/19 with appointment date attached thanks

## 2019-09-25 NOTE — Telephone Encounter (Signed)
Do you still have these records? She is on the schedule for 8/4 and I do not have them   Thanks

## 2019-09-27 NOTE — Telephone Encounter (Signed)
Got them thanks

## 2019-09-27 NOTE — Telephone Encounter (Signed)
Obtained records from [facility] sending them to you.

## 2019-10-02 ENCOUNTER — Ambulatory Visit (INDEPENDENT_AMBULATORY_CARE_PROVIDER_SITE_OTHER): Payer: BC Managed Care – PPO | Admitting: Gastroenterology

## 2019-10-02 ENCOUNTER — Encounter: Payer: Self-pay | Admitting: Gastroenterology

## 2019-10-02 VITALS — BP 120/64 | HR 68 | Ht 63.0 in | Wt 154.4 lb

## 2019-10-02 DIAGNOSIS — Z86010 Personal history of colonic polyps: Secondary | ICD-10-CM

## 2019-10-02 NOTE — Patient Instructions (Signed)
You have been scheduled for a colonoscopy. Please follow written instructions given to you at your visit today.  Please pick up your prep supplies at the pharmacy within the next 1-3 days. If you use inhalers (even only as needed), please bring them with you on the day of your procedure.   We have given you a SUTAB sample kit today for your prep  You can use Recticare and Preperation H over the counter 3-4 times daily as needed while doing your colonoscopy prep to relieve rectal discomfort   Due to recent changes in healthcare laws, you may see the results of your imaging and laboratory studies on MyChart before your provider has had a chance to review them.  We understand that in some cases there may be results that are confusing or concerning to you. Not all laboratory results come back in the same time frame and the provider may be waiting for multiple results in order to interpret others.  Please give [place] 48 hours in order for your provider to thoroughly review all the results before contacting the office for clarification of your results.   If you are age 29 or older, your body mass index should be between 23-30. Your Body mass index is 27.35 kg/m. If this is out of the aforementioned range listed, please consider follow up with your Primary Care Provider.  If you are age 75 or younger, your body mass index should be between 19-25. Your Body mass index is 27.35 kg/m. If this is out of the aformentioned range listed, please consider follow up with your Primary Care Provider.    I appreciate the  opportunity to care for you  Thank You   Harl Bowie , MD

## 2019-10-02 NOTE — Progress Notes (Signed)
[name]    [id number]    1956/02/11  Primary Care Physician:Richter, Maebelle Munroe, MD  Referring Physician: Hinton Rao, MD [place],  [place]   Chief complaint:  Colorectal cancer screening  HPI:  64 year old very pleasant female here for new patient visit to discuss colorectal cancer screening  Previously followed by Arta Silence, at [facility] Colonoscopy May 15, 2012: 3 mm sessile polyp, tubular adenoma removed from transverse colon.  Anal papilloma.  Denies any nausea, vomiting, abdominal pain, melena or bright red blood per rectum  Maternal grandmother had colon cancer in her 24's Paternal cousin with colon cancer in her 53's Brother had colon polyps removed    Outpatient Encounter Medications as of 10/02/2019  Medication Sig  . Calcium Carb-Cholecalciferol (CALCIUM-VITAMIN D) 500-200 MG-UNIT tablet Take by mouth.  . calcium-vitamin D (CALCIUM 500/D) 500-200 MG-UNIT tablet Take 1 tablet by mouth daily with breakfast.   . fish oil-omega-3 fatty acids 1000 MG capsule Take 1 g by mouth daily.  Marland Kitchen GLUCOSAMINE CHONDROITIN COMPLX PO Take 2 capsules by mouth daily.   Marland Kitchen JEVANTIQUE LO 0.5-2.5 MG-MCG tablet Reported on 05/04/2015  . meperidine (DEMEROL) 50 MG tablet Take 50 mg by mouth every 6 (six) hours as needed for severe pain. Dispensed 28 with no refills on 12/06/2016  . Multiple Vitamin (MULTIVITAMIN) tablet Take 1 tablet by mouth daily.  Marland Kitchen oxyCODONE-acetaminophen (PERCOCET) 10-325 MG tablet Take 1 tablet by mouth every 4 (four) hours as needed for pain (Take 1 tablet by mouth every four to six hours as needed for pain).  . promethazine (PHENERGAN) 25 MG tablet Take 25 mg by mouth every 8 (eight) hours as needed for nausea or vomiting. Dispensed 25 with no refills on 12/06/16   No facility-administered encounter medications on file as of 10/02/2019.    Allergies as of 10/02/2019 - Review Complete 02/01/2017  Allergen Reaction Noted  .  Percocet [oxycodone-acetaminophen] Itching 05/04/2015    Past Medical History:  Diagnosis Date  . Osteoarthritis   . Pneumonia     Past Surgical History:  Procedure Laterality Date  . ABDOMINAL HYSTERECTOMY N/A 04/28/2015   Procedure: TOTAL HYSTERECTOMY ABDOMINAL;  Surgeon: Nunzio Cobbs, MD;  Location: [facility];  Service: Gynecology;  Laterality: N/A;  . rectal nodule removal  05/2012   --benign with Dr. Elmo Putt Thomas([facility])  . SALPINGOOPHORECTOMY Bilateral 04/28/2015   Procedure: BILATERAL SALPINGO OOPHORECTOMY WITH PELVIC WASHINGS ;  Surgeon: Nunzio Cobbs, MD;  Location: [facility];  Service: Gynecology;  Laterality: Bilateral;  . TOE SURGERY Right 20 years ago   right small toe  . WISDOM TOOTH EXTRACTION      Family History  Problem Relation Age of Onset  . Cancer Maternal Grandmother 28       Colon Ca--Dec age 74 from age related problems  . Asthma Maternal Grandmother   . Hypertension Mother   . Thyroid disease Mother        parathyroid Dz  . Hyperlipidemia Brother     Social History   Socioeconomic History  . Marital status: Married    Spouse name: Not on file  . Number of children: Not on file  . Years of education: Not on file  . Highest education level: Not on file  Occupational History  . Not on file  Tobacco Use  . Smoking status: Former Smoker    Quit date: 02/28/1994  Years since quitting: 25.6  . Smokeless tobacco: Never Used  Substance and Sexual Activity  . Alcohol use: Yes    Alcohol/week: 7.0 - 10.0 standard drinks    Types: 7 - 10 Standard drinks or equivalent per week    Comment: 1-2 glasses of wine per day  . Drug use: No  . Sexual activity: Yes    Partners: Male    Birth control/protection: Post-menopausal, Surgical    Comment: TAH/BSO  Other Topics Concern  . Not on file  Social History Narrative  . Not on file   Social Determinants of Health   Financial Resource Strain:   . Difficulty of Paying Living Expenses:     Food Insecurity:   . Worried About Charity fundraiser in the Last Year:   . Arboriculturist in the Last Year:   Transportation Needs:   . Film/video editor (Medical):   Marland Kitchen Lack of Transportation (Non-Medical):   Physical Activity:   . Days of Exercise per Week:   . Minutes of Exercise per Session:   Stress:   . Feeling of Stress :   Social Connections:   . Frequency of Communication with Friends and Family:   . Frequency of Social Gatherings with Friends and Family:   . Attends Religious Services:   . Active Member of Clubs or Organizations:   . Attends Archivist Meetings:   Marland Kitchen Marital Status:   Intimate Partner Violence:   . Fear of Current or Ex-Partner:   . Emotionally Abused:   Marland Kitchen Physically Abused:   . Sexually Abused:       Review of systems: All other review of systems negative except as mentioned in the HPI.   Physical Exam: Vitals:   10/02/19 0906  BP: 120/64  Pulse: 68   Body mass index is 27.35 kg/m. Gen:      No acute distress Neuro: alert and oriented x 3 Psych: normal mood and affect  Data Reviewed:  Reviewed labs, radiology imaging, old records and pertinent past GI work up   Assessment and Plan/Recommendations:  64 year old very pleasant female with family history of colon cancer in second-degree relative, personal history of adenomatous colon polyps here to discuss colorectal cancer screening She is due for surveillance colonoscopy, will proceed to schedule  The risks and benefits as well as alternatives of endoscopic procedure(s) have been discussed and reviewed. All questions answered. The patient agrees to proceed.  Return as needed   The patient was provided an opportunity to ask questions and all were answered. The patient agreed with the plan and demonstrated an understanding of the instructions.  Damaris Hippo , MD    CC: Hinton Rao, MD

## 2019-10-03 ENCOUNTER — Encounter: Payer: Self-pay | Admitting: Gastroenterology

## 2019-12-05 ENCOUNTER — Encounter: Payer: BC Managed Care – PPO | Admitting: Gastroenterology

## 2020-05-25 ENCOUNTER — Other Ambulatory Visit: Payer: Self-pay | Admitting: Allergy

## 2020-05-25 ENCOUNTER — Ambulatory Visit
Admission: RE | Admit: 2020-05-25 | Discharge: 2020-05-25 | Disposition: A | Discharge status: Home / Self Care | Payer: Medicare Other | Source: Ambulatory Visit | Attending: Allergy | Admitting: Allergy

## 2020-05-25 ENCOUNTER — Other Ambulatory Visit: Payer: Self-pay

## 2020-05-25 DIAGNOSIS — J4530 Mild persistent asthma, uncomplicated: Secondary | ICD-10-CM

## 2020-05-28 ENCOUNTER — Telehealth: Payer: Self-pay | Admitting: Physician Assistant

## 2020-05-28 ENCOUNTER — Encounter: Payer: Self-pay | Admitting: *Deleted

## 2020-05-28 DIAGNOSIS — R918 Other nonspecific abnormal finding of lung field: Secondary | ICD-10-CM

## 2020-05-28 NOTE — Telephone Encounter (Signed)
Received a new pt referral from Dr. Dennie Fetters office for new dx of lung cancer. Pt has been cld and scheduled to see Cassie on 4/5 at 1:30pm w/labs at 1pm. Appt date and time has been lft on the pt's vm. I asked that she call to confirm the appt date and time.

## 2020-05-28 NOTE — Progress Notes (Signed)
I received referral on [name].  Patient had abnormal CXR with no other workup.  I updated new patient coordinator to call and schedule her to be seen with Cassie next week with labs

## 2020-05-29 NOTE — Progress Notes (Signed)
[place] [phone]   [phone]  CONSULT NOTE  REFERRING PHYSICIAN: Dr. Darron Doom  REASON FOR CONSULTATION:  Suspicious lung cancer   HPI [name] is a 65 y.o. female past medical history for hysterectomy in 2016 is referred to the clinic for evaluation of a lung mass.  The patient had been endorsing a sore throat and cough for several months. The patient was being seen by an allergist. A chest x-ray was performed as part of her workup which showed masslike consolidation in the right lung base worrisome for malignancy.  There is also a probable small amount of loculated pleural fluid.   The patient then had a stat CT scan of the chest performed on 05/27/2020 which showed a large 8.2 x 10.3 x 6.1 cm right lower lobe lung mass which is encasing the lower lobe bronchus and pulmonary artery.  There is also right hilar and subcarinal lymphadenopathy.  There is also a 6 mm low attenuating lesion in the right lobe of the liver which is indeterminate.  The patient was subsequently referred to the clinic today for further evaluation and management of her condition. Of note, the patient is scheduled to see pulmonologist, Dr. Lamonte Sakai on 06/04/20 who will likely discuss a plan for a biopsy of the lung mass.   Overall, the patient is feeling fair. Her main concern is the ongoing cough which has caused the throat soreness and chest soreness. She states some days she feels fatigued. She has tried several cough medications and cough drops. Regarding shortness of breath, she more so, describes decreased exercise tolerance. She walks 3x per day and used to do barr exercises; however, she notes she has been too tired after 5 minutes of exercising. She denies fevers, chills, or night sweats. She states she lost about 10 lbs unintentionally over the last 2 months. She denies nausea, vomiting, diarrhea, or constipation. She had one episode several weeks ago of blood tinged  sputum.    The patient states that her mother passed away at the age of 7 secondary to arthritis and dementia.  The patient's father is 17 and still living he reportedly is healthy except he recently has been having some "heart issues" and is getting a pacemaker in the next few weeks.  The patient has 2 brothers who are healthy besides hyperlipidemia.  The patient is married and does not have any children.  She worked in [employer], technical sales for approximately 30 years.  The patient states that she smoked for 21 years 1 pack of cigarettes per day.  She quit 25 years ago.  She drinks a few glasses of wine socially on the weekends.  Denies any history of drug use.   HPI  Past Medical History:  Diagnosis Date  . Colon polyps   . Osteoarthritis   . Pneumonia     Past Surgical History:  Procedure Laterality Date  . ABDOMINAL HYSTERECTOMY N/A 04/28/2015   Procedure: TOTAL HYSTERECTOMY ABDOMINAL;  Surgeon: Nunzio Cobbs, MD;  Location: [facility];  Service: Gynecology;  Laterality: N/A;  . BUNIONECTOMY Bilateral   . rectal nodule removal  05/2012   --benign with Dr. Elmo Putt Thomas([facility])  . SALPINGOOPHORECTOMY Bilateral 04/28/2015   Procedure: BILATERAL SALPINGO OOPHORECTOMY WITH PELVIC WASHINGS ;  Surgeon: Nunzio Cobbs, MD;  Location: [facility];  Service: Gynecology;  Laterality: Bilateral;  . WISDOM TOOTH EXTRACTION      Family History  Problem Relation Age of Onset  .  Cancer Maternal Grandmother 29       Colon Ca--Dec age 22 from age related problems  . Asthma Maternal Grandmother   . Hypertension Mother   . Thyroid disease Mother        parathyroid Dz  . Heart disease Mother   . Hyperlipidemia Brother   . Colon polyps Brother     Social History Social History   Tobacco Use  . Smoking status: Former Smoker    Quit date: 02/28/1994    Years since quitting: 26.2  . Smokeless tobacco: Never Used  Substance Use Topics  . Alcohol use: Yes    Alcohol/week: 6.0 standard drinks     Types: 6 Standard drinks or equivalent per week  . Drug use: No    Allergies  Allergen Reactions  . Morphine And Related Hives  . Percocet [Oxycodone-Acetaminophen] Itching    Current Outpatient Medications  Medication Sig Dispense Refill  . albuterol (VENTOLIN HFA) 108 (90 Base) MCG/ACT inhaler SMARTSIG:1-2 Puff(s) By Mouth Every 4-6 Hours PRN    . ALPRAZolam (XANAX) 0.25 MG tablet Take 0.125-0.25 mg by mouth at bedtime as needed.    . ASHWAGANDHA PO Take 1 tablet by mouth daily.    Marland Kitchen BREZTRI AEROSPHERE 160-9-4.8 MCG/ACT AERO Take 1 puff by mouth 2 (two) times daily.    . calcium-vitamin D (OSCAL WITH D) 500-200 MG-UNIT TABS tablet Take by mouth.    . fish oil-omega-3 fatty acids 1000 MG capsule Take 1 g by mouth daily.    . fluticasone (FLONASE) 50 MCG/ACT nasal spray Place 1 spray into both nostrils 2 (two) times daily.    Marland Kitchen FOLIC ACID PO Take 1 tablet by mouth daily.    Marland Kitchen GLUCOSAMINE CHONDROITIN COMPLX PO Take 2 capsules by mouth daily.     Marland Kitchen lidocaine (XYLOCAINE) 2 % solution SMARTSIG:By Mouth    . TURMERIC PO Take 1 tablet by mouth daily.     No current facility-administered medications for this visit.    REVIEW OF SYSTEMS:   Review of Systems  Constitutional: Positive for intermittent fatigue and 10 pound weight loss in the last 2 months. Negative for appetite change, chills, and fever.  HENT: Positive sore throat. Negative for mouth sores, nosebleeds, and trouble swallowing.   Eyes: Negative for eye problems and icterus.  Respiratory: Positive for cough. Negative for hemoptysis (resolved), shortness of breath and wheezing.   Cardiovascular: Positive for chest soreness with coughing. Negative for leg swelling.  Gastrointestinal: Negative for abdominal pain, constipation, diarrhea, nausea and vomiting.  Genitourinary: Negative for bladder incontinence, difficulty urinating, dysuria, frequency and hematuria.   Musculoskeletal: Negative for back pain, gait problem, neck pain  and neck stiffness.  Skin: Negative for itching and rash.  Neurological: Negative for dizziness, extremity weakness, gait problem, headaches, light-headedness and seizures.  Hematological: Negative for adenopathy. Does not bruise/bleed easily.  Psychiatric/Behavioral: Negative for confusion, depression and sleep disturbance. The patient is not nervous/anxious.     PHYSICAL EXAMINATION:  Blood pressure 130/76, pulse 83, temperature 97.8 F (36.6 C), temperature source Tympanic, resp. rate 15, height 5' 3"  (1.6 m), weight 141 lb 9.6 oz (64.2 kg), last menstrual period 02/28/2009, SpO2 100 %.  ECOG PERFORMANCE STATUS: 1 - Symptomatic but completely ambulatory  Physical Exam  Constitutional: Oriented to person, place, and time and well-developed, well-nourished, and in no distress.  HENT:  Head: Normocephalic and atraumatic.  Mouth/Throat: Oropharynx is clear and moist. No oropharyngeal exudate.  Eyes: Conjunctivae are normal. Right eye exhibits no discharge.  Left eye exhibits no discharge. No scleral icterus.  Neck: Normal range of motion. Neck supple.  Cardiovascular: Normal rate, regular rhythm, normal heart sounds and intact distal pulses.   Pulmonary/Chest: Effort normal. Decreased breath sounds in RLL. No respiratory distress. No wheezes. No rales.  Abdominal: Soft. Bowel sounds are normal. Exhibits no distension and no mass. There is no tenderness.  Musculoskeletal: Normal range of motion. Exhibits no edema.  Lymphadenopathy:    No cervical adenopathy.  Neurological: Alert and oriented to person, place, and time. Exhibits normal muscle tone. Gait normal. Coordination normal.  Skin: Skin is warm and dry. No rash noted. Not diaphoretic. No erythema. No pallor.  Psychiatric: Mood, memory and judgment normal.  Vitals reviewed.  LABORATORY DATA: Lab Results  Component Value Date   WBC 12.1 (H) 06/02/2020   HGB 11.8 (L) 06/02/2020   HCT 37.2 06/02/2020   MCV 93.2 06/02/2020   PLT  390 06/02/2020      Chemistry      Component Value Date/Time   NA 139 06/02/2020 1254   K 4.7 06/02/2020 1254   CL 101 06/02/2020 1254   CO2 27 06/02/2020 1254   BUN 15 06/02/2020 1254   CREATININE 0.70 06/02/2020 1254      Component Value Date/Time   CALCIUM 9.7 06/02/2020 1254   ALKPHOS 110 06/02/2020 1254   AST 20 06/02/2020 1254   ALT 18 06/02/2020 1254   BILITOT 0.5 06/02/2020 1254       RADIOGRAPHIC STUDIES: DG Chest 2 View  Result Date: 05/25/2020 CLINICAL DATA:  Asthma. EXAM: CHEST - 2 VIEW COMPARISON:  None. FINDINGS: Trachea is midline. Heart size normal. Masslike consolidation in the right lung base. There may be a tiny amount of loculated right pleural fluid. Left lung is clear. IMPRESSION: 1. Masslike consolidation in the right lung base is worrisome for malignancy. CT chest with contrast is recommended. This is a call report. 2. Probable tiny amount of loculated right pleural fluid. Electronically Signed   By: Lorin Picket M.D.   On: 05/25/2020 13:39    ASSESSMENT: This is a very pleasant 65 year old Caucasian female with suspicious lung caner. She presented with a large 8.2 x 10.3 x 6.1 cm right lower lobe lung mass which is encasing the lower lobe bronchus and pulmonary artery.  There is also right hilar and subcarinal lymphadenopathy.  There is also a 6 mm low attenuating lesion in the right lobe of the liver which is indeterminate.  Pending further staging work-up.    PLAN: The patient was seen with Dr. Julien Nordmann today.  Dr. Julien Nordmann had a lengthy discussion about her current condition and recommended further work-up.  Dr. Julien Nordmann discussed that this is concerning for lung cancer.  In order to complete the staging work-up, the patient requires a biopsy for tissue diagnosis.  She also needs a staging PET scan as well as a brain MRI.   She is scheduled to meet with Dr. Lamonte Sakai on 06/04/20 for consultation. I discussed that she should keep this appointment so they can  discuss possible bronchoscopy and biopsy to confirm the diagnosis.   We will see the patient back for follow-up visit in 10-14 days weeks for evaluation and to review the results of all of the pending lab studies. At that visit, we will be able to have a more detailed discussion with the patient about her recommended treatment options based on the staging workup and final pathology.   The patient voices understanding of current disease status  and treatment options and is in agreement with the current care plan.  All questions were answered. The patient knows to call the clinic with any problems, questions or concerns. We can certainly see the patient much sooner if necessary.  Thank you so much for allowing me to participate in the care of [name]. I will continue to follow up the patient with you and assist in her care.   Disclaimer: This note was dictated with voice recognition software. Similar sounding words can inadvertently be transcribed and may not be corrected upon review.   Vennela Jutte L Trace Cederberg June 02, 2020, 2:24 PM  ADDENDUM: Hematology/Oncology Attending: I had a face-to-face encounter with the patient today.  I reviewed her records, scans and recommended her care plan.  This is a very pleasant 65 years old white female with past medical history significant for osteoarthritis, colon polyps and pneumonia.  The patient has been complaining of sore throat and cough for several months.  She was seen by her primary care physician as well as allergist with no significant improvement of her symptoms.  During her work-up she finally had chest x-ray that showed masslike consolidation in the right lung base worrisome for malignancy with suspicious small amount of loculated pleural fluid.  This was followed by CT scan of the chest which was performed on May 27, 2020 and it showed a large 8.2 x 10.3 x 6.1 cm right lower lobe lung mass encasing the lower lobe bronchus and  pulmonary artery.  There was also right hilar and subcarinal lymphadenopathy in addition to 0.6 cm low-attenuation lesion in the right lobe of the liver that was indeterminate. The patient was referred to [place] today for evaluation and recommendation regarding her condition. When seen today she continues to complain of the persistent cough with soreness of her throat and chest pain.  She is also tired and fatigued most of the time. I had a lengthy discussion with the patient today about her current condition and further investigation to confirm her diagnosis. I recommended for the patient to have a PET scan as well as MRI of the brain for evaluation of any other metastatic disease. The patient was also referred to Dr. Lamonte Sakai and expected to see him later this week for consideration of bronchoscopy with endobronchial ultrasound and biopsies. I will arrange for the patient to come back for follow-up visit in around 2 weeks for evaluation and detailed discussion of her treatment options based on the final pathology and staging work-up. If the final pathology is consistent with non-small cell lung cancer, adenocarcinoma, we will send her tissue block to be tested for molecular studies and PD-L1 expression. The patient was advised to call immediately if she has any other concerning symptoms in the interval.  The total time spent in the appointment was 60 minutes. Disclaimer: This note was dictated with voice recognition software. Similar sounding words can inadvertently be transcribed and may be missed upon review. Eilleen Kempf, MD 06/02/20

## 2020-06-02 ENCOUNTER — Inpatient Hospital Stay: Payer: Medicare Other | Attending: Physician Assistant

## 2020-06-02 ENCOUNTER — Encounter: Payer: Self-pay | Admitting: *Deleted

## 2020-06-02 ENCOUNTER — Inpatient Hospital Stay: Payer: Medicare Other | Admitting: Physician Assistant

## 2020-06-02 ENCOUNTER — Other Ambulatory Visit: Payer: Self-pay

## 2020-06-02 ENCOUNTER — Encounter: Payer: Self-pay | Admitting: Physician Assistant

## 2020-06-02 VITALS — BP 130/76 | HR 83 | Temp 97.8°F | Resp 15 | Ht 63.0 in | Wt 141.6 lb

## 2020-06-02 DIAGNOSIS — R918 Other nonspecific abnormal finding of lung field: Secondary | ICD-10-CM | POA: Diagnosis present

## 2020-06-02 DIAGNOSIS — Z800 Family history of malignant neoplasm of digestive organs: Secondary | ICD-10-CM | POA: Diagnosis not present

## 2020-06-02 DIAGNOSIS — C3411 Malignant neoplasm of upper lobe, right bronchus or lung: Secondary | ICD-10-CM | POA: Diagnosis present

## 2020-06-02 DIAGNOSIS — Z79899 Other long term (current) drug therapy: Secondary | ICD-10-CM | POA: Insufficient documentation

## 2020-06-02 DIAGNOSIS — Z90710 Acquired absence of both cervix and uterus: Secondary | ICD-10-CM

## 2020-06-02 DIAGNOSIS — Z5111 Encounter for antineoplastic chemotherapy: Secondary | ICD-10-CM | POA: Diagnosis not present

## 2020-06-02 DIAGNOSIS — Z9079 Acquired absence of other genital organ(s): Secondary | ICD-10-CM | POA: Diagnosis not present

## 2020-06-02 DIAGNOSIS — Z8349 Family history of other endocrine, nutritional and metabolic diseases: Secondary | ICD-10-CM | POA: Diagnosis not present

## 2020-06-02 DIAGNOSIS — Z8249 Family history of ischemic heart disease and other diseases of the circulatory system: Secondary | ICD-10-CM | POA: Insufficient documentation

## 2020-06-02 DIAGNOSIS — Z7689 Persons encountering health services in other specified circumstances: Secondary | ICD-10-CM | POA: Insufficient documentation

## 2020-06-02 DIAGNOSIS — Z90722 Acquired absence of ovaries, bilateral: Secondary | ICD-10-CM | POA: Diagnosis not present

## 2020-06-02 DIAGNOSIS — Z87891 Personal history of nicotine dependence: Secondary | ICD-10-CM | POA: Insufficient documentation

## 2020-06-02 LAB — CMP (CANCER CENTER ONLY)
ALT: 18 U/L (ref 0–44)
AST: 20 U/L (ref 15–41)
Albumin: 3.5 g/dL (ref 3.5–5.0)
Alkaline Phosphatase: 110 U/L (ref 38–126)
Anion gap: 11 (ref 5–15)
BUN: 15 mg/dL (ref 8–23)
CO2: 27 mmol/L (ref 22–32)
Calcium: 9.7 mg/dL (ref 8.9–10.3)
Chloride: 101 mmol/L (ref 98–111)
Creatinine: 0.7 mg/dL (ref 0.44–1.00)
GFR, Estimated: >60 mL/min (ref >60)
Glucose, Bld: 103 mg/dL — ABNORMAL HIGH (ref 70–99)
Potassium: 4.7 mmol/L (ref 3.5–5.1)
Sodium: 139 mmol/L (ref 135–145)
Total Bilirubin: 0.5 mg/dL (ref 0.3–1.2)
Total Protein: 7 g/dL (ref 6.5–8.1)

## 2020-06-02 LAB — CBC WITH DIFFERENTIAL (CANCER CENTER ONLY)
Abs Immature Granulocytes: 0.07 10*3/uL (ref 0.00–0.07)
Basophils Absolute: 0.1 10*3/uL (ref 0.0–0.1)
Basophils Relative: 0 %
Eosinophils Absolute: 0.4 10*3/uL (ref 0.0–0.5)
Eosinophils Relative: 4 %
HCT: 37.2 % (ref 36.0–46.0)
Hemoglobin: 11.8 g/dL — ABNORMAL LOW (ref 12.0–15.0)
Immature Granulocytes: 1 %
Lymphocytes Relative: 16 %
Lymphs Abs: 2 10*3/uL (ref 0.7–4.0)
MCH: 29.6 pg (ref 26.0–34.0)
MCHC: 31.7 g/dL (ref 30.0–36.0)
MCV: 93.2 fL (ref 80.0–100.0)
Monocytes Absolute: 1.2 10*3/uL — ABNORMAL HIGH (ref 0.1–1.0)
Monocytes Relative: 10 %
Neutro Abs: 8.4 10*3/uL — ABNORMAL HIGH (ref 1.7–7.7)
Neutrophils Relative %: 69 %
Platelet Count: 390 10*3/uL (ref 150–400)
RBC: 3.99 MIL/uL (ref 3.87–5.11)
RDW: 13.2 % (ref 11.5–15.5)
WBC Count: 12.1 10*3/uL — ABNORMAL HIGH (ref 4.0–10.5)
nRBC: 0 % (ref 0.0–0.2)

## 2020-06-02 NOTE — Progress Notes (Signed)
I contacted radiology dept to get CT scan from [facility] to be push to PACS.

## 2020-06-02 NOTE — Patient Instructions (Addendum)
Summary:  -We covered a lot of important information at your appointment today. Here are the the main points that were discussed at your office visit with [place] today:  -There are two main categories of lung cancer, they are named based on the size of the cancer cell. One is called Non-Small cell lung cancer. The other type is Small Cell Lung Cancer -We need to get a sample (biopsy) of the tumor to determine how to best treat you. You have an appointment with pulmonologist, Dr. Lamonte Sakai, who will discuss in more details the proposed procedure in order to get the biopsy. It looks like you are scheduled to see him on 06/04/20. Please keep that appointment.  -We still need to complete the workup. We need to "stage" which tells [place] best how to treat you. In order to do that, we need a few more imaging studies. We need a brain MRI just to make sure that we are not missing anything traveling to this area. We also need a PET scan which lets [place] the rest of the body to see if any areas are involved.   Referrals or Imaging: -Someone from the radiology scheduling department will call you to schedule the scans for the MRI and the PET scan. If you do not hear from them, their number is [phone].    Follow up:  -We will see you back for a follow up visit ~10 days or so to review the scans and go over the biopsy results and for a more detailed discussion about the recommended treatment.   -If you need to reach [place] at any time, the main office number to the cancer center is [phone], when you call, ask to speak to either Cassie's or Dr. Worthy Flank nurse.

## 2020-06-02 NOTE — Progress Notes (Signed)
I spoke with Ms. Kirksey today at her first visit to the cancer center.  Patient has a large lung mass and Dr. Julien Nordmann and Rubin Payor PA-CV reviewed with her.  Her plan of care is for further work up with scans and bx.  She is set up with the pulmonary team to be seen this Thursday.  I reached out to [place] coordinator to help get scans authed with insurance and then scheduled.

## 2020-06-03 ENCOUNTER — Encounter: Payer: Self-pay | Admitting: *Deleted

## 2020-06-03 ENCOUNTER — Telehealth: Payer: Self-pay | Admitting: *Deleted

## 2020-06-03 NOTE — Telephone Encounter (Signed)
I received notification that MRI brain is authed with insurance. I called central scheduling and was given an appt.  I called patient with an update on appt time and place. She verbalized understanding.

## 2020-06-03 NOTE — Progress Notes (Signed)
I received a message from our radiology dept that [facility] does not have any scans on [name].  She did have a scan at [facility] on [address] on 3/30.  I called [facility] and left a message to see about getting her scans in our PACS system.

## 2020-06-04 ENCOUNTER — Encounter: Payer: Self-pay | Admitting: Emergency Medicine

## 2020-06-04 ENCOUNTER — Telehealth: Payer: Self-pay | Admitting: Emergency Medicine

## 2020-06-04 ENCOUNTER — Other Ambulatory Visit: Payer: Self-pay

## 2020-06-04 ENCOUNTER — Ambulatory Visit: Payer: Medicare Other | Admitting: Emergency Medicine

## 2020-06-04 ENCOUNTER — Ambulatory Visit
Admission: RE | Admit: 2020-06-04 | Discharge: 2020-06-04 | Disposition: A | Discharge status: Home / Self Care | Payer: Self-pay | Source: Ambulatory Visit | Attending: Internal Medicine | Admitting: Internal Medicine

## 2020-06-04 ENCOUNTER — Telehealth: Payer: Self-pay | Admitting: *Deleted

## 2020-06-04 ENCOUNTER — Encounter: Payer: Self-pay | Admitting: *Deleted

## 2020-06-04 VITALS — BP 122/62 | HR 90 | Temp 97.4°F | Ht 63.0 in | Wt 139.4 lb

## 2020-06-04 DIAGNOSIS — R918 Other nonspecific abnormal finding of lung field: Secondary | ICD-10-CM

## 2020-06-04 DIAGNOSIS — J449 Chronic obstructive pulmonary disease, unspecified: Secondary | ICD-10-CM

## 2020-06-04 LAB — BASIC METABOLIC PANEL
BUN: 14 mg/dL (ref 6–23)
CO2: 29 mEq/L (ref 19–32)
Calcium: 9.5 mg/dL (ref 8.4–10.5)
Chloride: 102 mEq/L (ref 96–112)
Creatinine, Ser: 0.57 mg/dL (ref 0.40–1.20)
GFR: 95.57 mL/min (ref >60.00)
Glucose, Bld: 104 mg/dL — ABNORMAL HIGH (ref 70–99)
Potassium: 4.5 mEq/L (ref 3.5–5.1)
Sodium: 138 mEq/L (ref 135–145)

## 2020-06-04 LAB — PROTIME-INR
INR: 1.2 ratio — ABNORMAL HIGH (ref 0.8–1.0)
Prothrombin Time: 12.9 s (ref 9.6–13.1)

## 2020-06-04 NOTE — Telephone Encounter (Signed)
Pt had CT performed at [facility] 05/27/20. Pt was seen today 4/7 for a consultation appointment and Dr. Lamonte Sakai is wanting to get a disc of that CT. Attempted to call pt to see which [facility] she went to for that CT so I can try to see if I can get a phone number of the facility so I can call to request a disc of that scan.   Left message for pt to return call.

## 2020-06-04 NOTE — Telephone Encounter (Signed)
Pt came by office to drop off a disc of her recent CT that was performed at [facility] and while pt was here, Vallarie Mare went out to the lobby to give her the information for her upcoming EBUS. Nothing further needed.

## 2020-06-04 NOTE — Telephone Encounter (Signed)
I received a message that PET is auth with insurance. I called central scheduling and was given an appt. I called patient to update on appt and pre-procedure instructions. She verbalized understanding.

## 2020-06-04 NOTE — Assessment & Plan Note (Signed)
Continue your Breztri 2 puffs twice a day for now.  Rinse and gargle after using. Keep your albuterol available to use if needed for shortness of breath.  You can also continue to pretreat exercise as you have been doing.

## 2020-06-04 NOTE — Patient Instructions (Signed)
We will work on setting up bronchoscopy to evaluate your right lung lesion.  We will try to get this done next week.  This is an outpatient procedure.  You will need a designated driver.  You will be contacted with scheduling details by our office. Continue your Breztri 2 puffs twice a day for now.  Rinse and gargle after using. Keep your albuterol available to use if needed for shortness of breath.  You can also continue to pretreat exercise as you have been doing. Follow with Dr Lamonte Sakai in 1 month

## 2020-06-04 NOTE — Progress Notes (Signed)
Subjective:    Patient ID: [name], female    DOB: 11-04-1955, 65 y.o.   MRN: [id number]  HPI [name] is 65, former smoker (20 pack years) with a history of osteoarthritis, COPD/asthma, GERD.  She is referred today for an abnormal CT scan of the chest.   She reports she started to have sore throat and cough end of 2021. Has experienced progressive exertional SOB over last few months.  She was treated for allergies. The cough persisted. She has been managed on breztri since January, albuterol which she uses before exercise - it does help her, mucinex. Was referred for allergy / asthma eval.  A CXR was done by Dr Donneta Romberg 3/29, prompted CT chest as below.  CT chest with IV contrast 05/27/2020 performed at [facility].  I have the written report available without the images.  This showed a 8.2 x 10.3 x 6.1 cm right lower lobe mass that encased the lower lobe bronchus which is almost completely obstructed.  There is encasement of the right lower lobe pulmonary artery as well with associated right hilar and subcarinal lymphadenopathy.  Note was also made of a separate 1 cm superior segmental right lower lobe nodule and a 6 mm low-attenuation lesion in the right lobe of the liver that was indeterminate.   Review of Systems As per HPI  Past Medical History:  Diagnosis Date  . Colon polyps   . Osteoarthritis   . Pneumonia      Family History  Problem Relation Age of Onset  . Cancer Maternal Grandmother 34       Colon Ca--Dec age 92 from age related problems  . Asthma Maternal Grandmother   . Hypertension Mother   . Thyroid disease Mother        parathyroid Dz  . Heart disease Mother   . Hyperlipidemia Brother   . Colon polyps Brother      Social History   Socioeconomic History  . Marital status: Married    Spouse name: Not on file  . Number of children: Not on file  . Years of education: Not on file  . Highest education level: Not on file  Occupational History  . Not on file   Tobacco Use  . Smoking status: Former Smoker    Packs/day: 1.00    Years: 20.00    Pack years: 20.00    Types: Cigarettes    Start date: 41    Quit date: 02/28/1994    Years since quitting: 26.2  . Smokeless tobacco: Never Used  Substance and Sexual Activity  . Alcohol use: Yes    Alcohol/week: 6.0 standard drinks    Types: 6 Standard drinks or equivalent per week  . Drug use: No  . Sexual activity: Yes    Partners: Male    Birth control/protection: Post-menopausal, Surgical    Comment: TAH/BSO  Other Topics Concern  . Not on file  Social History Narrative  . 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 Morphine And Related Hives  . Percocet [Oxycodone-Acetaminophen] Itching     Outpatient Medications Prior to Visit  Medication Sig Dispense  Refill  . albuterol (VENTOLIN HFA) 108 (90 Base) MCG/ACT inhaler Inhale 1-2 puffs into the lungs every 4 (four) hours as needed for wheezing or shortness of breath.    . ALPRAZolam (XANAX) 0.25 MG tablet Take 0.25 mg by mouth at bedtime as needed for sleep.    Marland Kitchen BREZTRI AEROSPHERE 160-9-4.8 MCG/ACT AERO Take 1 puff by mouth 2 (two) times daily.    . fluticasone (FLONASE) 50 MCG/ACT nasal spray Place 1 spray into both nostrils daily as needed for allergies.    . Ashwagandha 500 MG CAPS Take 500 mg by mouth daily.    . Calcium-Magnesium-Vitamin D 400-166.7-133.3 MG-MG-UNIT TABS Take 1 tablet by mouth daily.    Marland Kitchen dextromethorphan-guaiFENesin (MUCINEX DM) 30-600 MG 12hr tablet Take 1 tablet by mouth 2 (two) times daily.    . fluticasone (FLOVENT HFA) 110 MCG/ACT inhaler Inhale 1 puff into the lungs daily.    . folic acid (FOLVITE) 1  MG tablet Take 1 mg by mouth daily.    Marland Kitchen glucosamine-chondroitin 500-400 MG tablet Take 1 tablet by mouth daily.    Marland Kitchen ibuprofen (ADVIL) 400 MG tablet Take 1 tablet by mouth as needed.    . montelukast (SINGULAIR) 10 MG tablet Take 1 tablet by mouth at bedtime.    . Omega-3 Fatty Acids (OMEGA-3 FISH OIL) 500 MG CAPS Take 1 capsule by mouth daily.    . sucralfate (CARAFATE) 1 g tablet Take 1 tablet by mouth in the morning and at bedtime.    . Turmeric 450 MG CAPS Take 1 tablet by mouth daily.    Marland Kitchen lidocaine (XYLOCAINE) 2 % solution Use as directed in the mouth or throat every 6 (six) hours as needed (throat pain).    . ASHWAGANDHA PO Take 1 tablet by mouth daily.    . calcium-vitamin D (OSCAL WITH D) 500-200 MG-UNIT TABS tablet Take by mouth.    . fish oil-omega-3 fatty acids 1000 MG capsule Take 1 g by mouth daily.    Marland Kitchen FOLIC ACID PO Take 1 tablet by mouth daily.    Marland Kitchen GLUCOSAMINE CHONDROITIN COMPLX PO Take 2 capsules by mouth daily.     . TURMERIC PO Take 1 tablet by mouth daily.     No facility-administered medications prior to visit.         Objective:   Physical Exam Vitals:   06/04/20 0938  BP: 122/62  Pulse: 90  Temp: (!) 97.4 F (36.3 C)  TempSrc: Temporal  SpO2: 98%  Weight: 139 lb 6.4 oz (63.2 kg)  Height: 5\' 3"  (1.6 m)   Gen: Pleasant, well-nourished, in no distress,  normal affect  ENT: No lesions,  mouth clear,  oropharynx clear, no postnasal drip  Neck: No JVD, no stridor  Lungs: No use of accessory muscles, somewhat decreased on the right, no crackles or wheezing on normal respiration, no wheeze on forced expiration  Cardiovascular: RRR, heart sounds normal, no murmur or gallops, no peripheral edema  Musculoskeletal: No deformities, no cyanosis or clubbing  Neuro: alert, awake, non focal  Skin: Warm, no lesions or rash      Assessment & Plan:  Lung mass She has a right lower lobe mass with hilar involvement, suspected endobronchial lesion based on  radiology report.  She needs bronchoscopy with EBUS soon as possible.  I will work on arranging for next week.  We will work on setting up bronchoscopy to evaluate your right lung lesion.  We will try to get this done next week.  This is an outpatient procedure.  You will need a designated driver.  You will be contacted with scheduling details by our office. Follow with Dr Lamonte Sakai in 1 month  COPD (chronic obstructive pulmonary disease) ([place]) Continue your Breztri 2 puffs twice a day for now.  Rinse and gargle after using. Keep your albuterol available to use if needed for shortness of breath.  You can also continue to pretreat exercise as you have been doing.  Baltazar Apo, MD, PhD 06/04/2020, 4:56 PM [facility] [phone] or if no answer before 7:00PM call [phone] For any issues after 7:00PM please call eLink [phone]

## 2020-06-04 NOTE — Telephone Encounter (Signed)
I scheduled pt for EBUS on 4/14 at 8:45 at [facility].  Covid test scheduled for 4/11.  I have left pt vm to call me back for appt info.

## 2020-06-04 NOTE — Telephone Encounter (Signed)
Pt came back by office and dropped off a disc that she had of her CT scan and this disc was given to Dr. Lamonte Sakai.  While pt was here, Vallarie Mare went out to the lobby to discuss the information with her of her upcoming EBUS which is scheduled for 4/14. Nothing further needed.

## 2020-06-04 NOTE — Progress Notes (Signed)
I received a call from [facility] stating they will push CT threw.  I updated [facility] with radiology.

## 2020-06-04 NOTE — H&P (View-Only) (Signed)
Subjective:    Patient ID: [name], female    DOB: 26-May-1955, 65 y.o.   MRN: [id number]  HPI [name] is 65, former smoker (20 pack years) with a history of osteoarthritis, COPD/asthma, GERD.  She is referred today for an abnormal CT scan of the chest.   She reports she started to have sore throat and cough end of 2021. Has experienced progressive exertional SOB over last few months.  She was treated for allergies. The cough persisted. She has been managed on breztri since January, albuterol which she uses before exercise - it does help her, mucinex. Was referred for allergy / asthma eval.  A CXR was done by Dr Donneta Romberg 3/29, prompted CT chest as below.  CT chest with IV contrast 05/27/2020 performed at [facility]  I have the written report available without the images.  This showed a 8.2 x 10.3 x 6.1 cm right lower lobe mass that encased the lower lobe bronchus which is almost completely obstructed.  There is encasement of the right lower lobe pulmonary artery as well with associated right hilar and subcarinal lymphadenopathy.  Note was also made of a separate 1 cm superior segmental right lower lobe nodule and a 6 mm low-attenuation lesion in the right lobe of the liver that was indeterminate.   Review of Systems As per HPI  Past Medical History:  Diagnosis Date  . Colon polyps   . Osteoarthritis   . Pneumonia      Family History  Problem Relation Age of Onset  . Cancer Maternal Grandmother 24       Colon Ca--Dec age 19 from age related problems  . Asthma Maternal Grandmother   . Hypertension Mother   . Thyroid disease Mother        parathyroid Dz  . Heart disease Mother   . Hyperlipidemia Brother   . Colon polyps Brother      Social History   Socioeconomic History  . Marital status: Married    Spouse name: Not on file  . Number of children: Not on file  . Years of education: Not on file  . Highest education level: Not on file  Occupational History  . Not on file   Tobacco Use  . Smoking status: Former Smoker    Packs/day: 1.00    Years: 20.00    Pack years: 20.00    Types: Cigarettes    Start date: 42    Quit date: 02/28/1994    Years since quitting: 26.2  . Smokeless tobacco: Never Used  Substance and Sexual Activity  . Alcohol use: Yes    Alcohol/week: 6.0 standard drinks    Types: 6 Standard drinks or equivalent per week  . Drug use: No  . Sexual activity: Yes    Partners: Male    Birth control/protection: Post-menopausal, Surgical    Comment: TAH/BSO  Other Topics Concern  . Not on file  Social History Narrative  . 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 Morphine And Related Hives  . Percocet [Oxycodone-Acetaminophen] Itching     Outpatient Medications Prior to Visit  Medication Sig Dispense  Refill  . albuterol (VENTOLIN HFA) 108 (90 Base) MCG/ACT inhaler Inhale 1-2 puffs into the lungs every 4 (four) hours as needed for wheezing or shortness of breath.    . ALPRAZolam (XANAX) 0.25 MG tablet Take 0.25 mg by mouth at bedtime as needed for sleep.    Marland Kitchen BREZTRI AEROSPHERE 160-9-4.8 MCG/ACT AERO Take 1 puff by mouth 2 (two) times daily.    . fluticasone (FLONASE) 50 MCG/ACT nasal spray Place 1 spray into both nostrils daily as needed for allergies.    . Ashwagandha 500 MG CAPS Take 500 mg by mouth daily.    . Calcium-Magnesium-Vitamin D 400-166.7-133.3 MG-MG-UNIT TABS Take 1 tablet by mouth daily.    Marland Kitchen dextromethorphan-guaiFENesin (MUCINEX DM) 30-600 MG 12hr tablet Take 1 tablet by mouth 2 (two) times daily.    . fluticasone (FLOVENT HFA) 110 MCG/ACT inhaler Inhale 1 puff into the lungs daily.    . folic acid (FOLVITE) 1  MG tablet Take 1 mg by mouth daily.    Marland Kitchen glucosamine-chondroitin 500-400 MG tablet Take 1 tablet by mouth daily.    Marland Kitchen ibuprofen (ADVIL) 400 MG tablet Take 1 tablet by mouth as needed.    . montelukast (SINGULAIR) 10 MG tablet Take 1 tablet by mouth at bedtime.    . Omega-3 Fatty Acids (OMEGA-3 FISH OIL) 500 MG CAPS Take 1 capsule by mouth daily.    . sucralfate (CARAFATE) 1 g tablet Take 1 tablet by mouth in the morning and at bedtime.    . Turmeric 450 MG CAPS Take 1 tablet by mouth daily.    Marland Kitchen lidocaine (XYLOCAINE) 2 % solution Use as directed in the mouth or throat every 6 (six) hours as needed (throat pain).    . ASHWAGANDHA PO Take 1 tablet by mouth daily.    . calcium-vitamin D (OSCAL WITH D) 500-200 MG-UNIT TABS tablet Take by mouth.    . fish oil-omega-3 fatty acids 1000 MG capsule Take 1 g by mouth daily.    Marland Kitchen FOLIC ACID PO Take 1 tablet by mouth daily.    Marland Kitchen GLUCOSAMINE CHONDROITIN COMPLX PO Take 2 capsules by mouth daily.     . TURMERIC PO Take 1 tablet by mouth daily.     No facility-administered medications prior to visit.         Objective:   Physical Exam Vitals:   06/04/20 0938  BP: 122/62  Pulse: 90  Temp: (!) 97.4 F (36.3 C)  TempSrc: Temporal  SpO2: 98%  Weight: 139 lb 6.4 oz (63.2 kg)  Height: 5\' 3"  (1.6 m)   Gen: Pleasant, well-nourished, in no distress,  normal affect  ENT: No lesions,  mouth clear,  oropharynx clear, no postnasal drip  Neck: No JVD, no stridor  Lungs: No use of accessory muscles, somewhat decreased on the right, no crackles or wheezing on normal respiration, no wheeze on forced expiration  Cardiovascular: RRR, heart sounds normal, no murmur or gallops, no peripheral edema  Musculoskeletal: No deformities, no cyanosis or clubbing  Neuro: alert, awake, non focal  Skin: Warm, no lesions or rash      Assessment & Plan:  Lung mass She has a right lower lobe mass with hilar involvement, suspected endobronchial lesion based on  radiology report.  She needs bronchoscopy with EBUS soon as possible.  I will work on arranging for next week.  We will work on setting up bronchoscopy to evaluate your right lung lesion.  We will try to get this done next week.  This is an outpatient procedure.  You will need a designated driver.  You will be contacted with scheduling details by our office. Follow with Dr Lamonte Sakai in 1 month  COPD (chronic obstructive pulmonary disease) ([place]) Continue your Breztri 2 puffs twice a day for now.  Rinse and gargle after using. Keep your albuterol available to use if needed for shortness of breath.  You can also continue to pretreat exercise as you have been doing.  Baltazar Apo, MD, PhD 06/04/2020, 4:56 PM [facility] [phone] or if no answer before 7:00PM call [phone] For any issues after 7:00PM please call eLink [phone]

## 2020-06-04 NOTE — Assessment & Plan Note (Addendum)
She has a right lower lobe mass with hilar involvement, suspected endobronchial lesion based on radiology report.  She needs bronchoscopy with EBUS soon as possible.  I will work on arranging for next week.  We will work on setting up bronchoscopy to evaluate your right lung lesion.  We will try to get this done next week.  This is an outpatient procedure.  You will need a designated driver.  You will be contacted with scheduling details by our office. Follow with Dr Lamonte Sakai in 1 month

## 2020-06-05 ENCOUNTER — Telehealth: Payer: Self-pay | Admitting: Physician Assistant

## 2020-06-05 NOTE — Telephone Encounter (Signed)
Scheduled per los. Called and spoke with patient. Confirmed appt 

## 2020-06-07 ENCOUNTER — Ambulatory Visit (HOSPITAL_COMMUNITY)
Admission: RE | Admit: 2020-06-07 | Discharge: 2020-06-07 | Disposition: A | Discharge status: Home / Self Care | Payer: Medicare Other | Source: Ambulatory Visit | Attending: Physician Assistant | Admitting: Physician Assistant

## 2020-06-07 ENCOUNTER — Other Ambulatory Visit: Payer: Self-pay

## 2020-06-07 DIAGNOSIS — R918 Other nonspecific abnormal finding of lung field: Secondary | ICD-10-CM | POA: Diagnosis present

## 2020-06-07 MED ORDER — GADOBUTROL 1 MMOL/ML IV SOLN
6.0000 mL | Freq: Once | INTRAVENOUS | Status: AC | PRN
  Administered 2020-06-07: 6 mL via INTRAVENOUS

## 2020-06-08 ENCOUNTER — Telehealth: Payer: Self-pay | Admitting: Emergency Medicine

## 2020-06-08 ENCOUNTER — Other Ambulatory Visit (HOSPITAL_COMMUNITY)
Admission: RE | Admit: 2020-06-08 | Discharge: 2020-06-08 | Disposition: A | Discharge status: Home / Self Care | Payer: Medicare Other | Source: Ambulatory Visit | Attending: Emergency Medicine | Admitting: Emergency Medicine

## 2020-06-08 DIAGNOSIS — Z20822 Contact with and (suspected) exposure to covid-19: Secondary | ICD-10-CM | POA: Insufficient documentation

## 2020-06-08 DIAGNOSIS — Z01812 Encounter for preprocedural laboratory examination: Secondary | ICD-10-CM | POA: Insufficient documentation

## 2020-06-08 NOTE — Telephone Encounter (Signed)
Called and spoke with patient, advised that she will be sedated during the procedure and that she will have a scope placed in her airway so she may have some throat irritation and may feel tired after the procedure.  Advised that I would send her message to Dr. Lamonte Sakai to get clarification on her question and once we heard back from her we would call her back.  Dr. Lamonte Sakai, Patient has a video bronchoscopy with endobronchial ultrasound planned for 06/11/20, she is wondering if she needs to cancel her [place] plans with her family since she has them over to her house for Easter.  Will she have any specific restrictions?  Please advise.  Thank you.

## 2020-06-08 NOTE — Telephone Encounter (Signed)
Pt is calling with questions regarding how she will be feeling after procedure w/ Dr. Lamonte Sakai.  pt is scheduled to have video bronchoscopy with endobronchial ultrasound on 06/11/20. Pt has upcoming Easter plans with family over the weekend.  pt is wondering if she should cancel plans with family to rest or will she be okay after procedure.. please advise [phone]

## 2020-06-09 LAB — SARS CORONAVIRUS 2 (TAT 6-24 HRS): SARS Coronavirus 2: NEGATIVE

## 2020-06-09 NOTE — Telephone Encounter (Signed)
Please let her know that most people feel back to normal except for some throat irritation the next day after this procedure. I don't think she should cancel her easter plans until we see how she feels the day after.

## 2020-06-09 NOTE — Progress Notes (Deleted)
[place] OFFICE PROGRESS NOTE  Hayden Rasmussen, MD [place] [address]  DIAGNOSIS: ***She presented with a large 8.2 x 10.3 x 6.1 cm right lower lobe lung mass which is encasing the lower lobe bronchus and pulmonary artery.  There is also right hilar and subcarinal lymphadenopathy.  There is also a 6 mm low attenuating lesion in the right lobe of the liver which is indeterminate.   PRIOR THERAPY: None  CURRENT THERAPY: ***  INTERVAL HISTORY: [name] 65 y.o. female returns to the clinic today for a follow up visit.  The patient was first seen in the clinic on 06/02/2020 with a concerning large right lower lobe lung mass pending tissue diagnosis and further staging work-up.  She had a staging brain MRI performed on 06/07/2020.  She is scheduled for a staging PET scan on 06/17/2020.  In the interval since her last appointment, the patient met with Dr. Lamonte Sakai from pulmonology.  He performed a bronchoscopy and biopsy on 06/12/2019 which the pathology was consistent with _.   Otherwise, the patient denies any new symptoms today she continues to have an ongoing cough and decreased exercise tolerance.  Cough has clots some throat soreness and chest soreness. She states some days she feels fatigued. She denies fevers, chills, or night sweats. She states she lost about 10 lbs unintentionally over the last 2 months. She denies nausea, vomiting, diarrhea, or constipation. She had one episode several weeks ago of blood tinged sputum.    She is here today for evaluation and for more detailed discussion about her current condition and recommended treatment options.  MEDICAL HISTORY: Past Medical History:  Diagnosis Date  . Colon polyps   . Osteoarthritis   . Pneumonia     ALLERGIES:  is allergic to morphine and related and percocet [oxycodone-acetaminophen].  MEDICATIONS:  Current Outpatient Medications  Medication Sig Dispense Refill  . albuterol  (VENTOLIN HFA) 108 (90 Base) MCG/ACT inhaler Inhale 1-2 puffs into the lungs every 4 (four) hours as needed for wheezing or shortness of breath.    . ALPRAZolam (XANAX) 0.25 MG tablet Take 0.25 mg by mouth at bedtime as needed for sleep.    . ASHWAGANDHA PO Take 1 capsule by mouth daily.    Marland Kitchen BREZTRI AEROSPHERE 160-9-4.8 MCG/ACT AERO Take 1 puff by mouth 2 (two) times daily.    . fluticasone (FLONASE) 50 MCG/ACT nasal spray Place 1 spray into both nostrils daily as needed for allergies.    . folic acid (FOLVITE) 191 MCG tablet Take 400 mcg by mouth daily.    . Glucosamine-Chondroitin (OSTEO BI-FLEX REGULAR STRENGTH PO) Take 2 tablets by mouth daily.    Marland Kitchen ibuprofen (ADVIL) 200 MG tablet Take 400 mg by mouth every 6 (six) hours as needed for headache or moderate pain.    Marland Kitchen lidocaine (XYLOCAINE) 2 % solution Use as directed in the mouth or throat every 6 (six) hours as needed (throat pain).    . Multiple Minerals-Vitamins (MULTISOURCE CALCIUM MAG/D) TABS Take 1 tablet by mouth daily.    . Omega-3 Fatty Acids (FISH OIL) 1000 MG CAPS Take 1,000 mg by mouth daily.    . TURMERIC PO Take 1 tablet by mouth daily.     No current facility-administered medications for this visit.    SURGICAL HISTORY:  Past Surgical History:  Procedure Laterality Date  . ABDOMINAL HYSTERECTOMY N/A 04/28/2015   Procedure: TOTAL HYSTERECTOMY ABDOMINAL;  Surgeon: Jamey Reas  Lorenz Coaster, MD;  Location: [facility];  Service: Gynecology;  Laterality: N/A;  . BUNIONECTOMY Bilateral   . rectal nodule removal  05/2012   --benign with Dr. Elmo Putt Thomas([facility])  . SALPINGOOPHORECTOMY Bilateral 04/28/2015   Procedure: BILATERAL SALPINGO OOPHORECTOMY WITH PELVIC WASHINGS ;  Surgeon: Nunzio Cobbs, MD;  Location: [facility];  Service: Gynecology;  Laterality: Bilateral;  . WISDOM TOOTH EXTRACTION      REVIEW OF SYSTEMS:   Review of Systems  Constitutional: Negative for appetite change, chills, fatigue, fever and unexpected weight  change.  HENT:   Negative for mouth sores, nosebleeds, sore throat and trouble swallowing.   Eyes: Negative for eye problems and icterus.  Respiratory: Negative for cough, hemoptysis, shortness of breath and wheezing.   Cardiovascular: Negative for chest pain and leg swelling.  Gastrointestinal: Negative for abdominal pain, constipation, diarrhea, nausea and vomiting.  Genitourinary: Negative for bladder incontinence, difficulty urinating, dysuria, frequency and hematuria.   Musculoskeletal: Negative for back pain, gait problem, neck pain and neck stiffness.  Skin: Negative for itching and rash.  Neurological: Negative for dizziness, extremity weakness, gait problem, headaches, light-headedness and seizures.  Hematological: Negative for adenopathy. Does not bruise/bleed easily.  Psychiatric/Behavioral: Negative for confusion, depression and sleep disturbance. The patient is not nervous/anxious.     PHYSICAL EXAMINATION:  Last menstrual period 02/28/2009.  ECOG PERFORMANCE STATUS: {CHL ONC ECOG [id number]  Physical Exam  Constitutional: Oriented to person, place, and time and well-developed, well-nourished, and in no distress. No distress.  HENT:  Head: Normocephalic and atraumatic.  Mouth/Throat: Oropharynx is clear and moist. No oropharyngeal exudate.  Eyes: Conjunctivae are normal. Right eye exhibits no discharge. Left eye exhibits no discharge. No scleral icterus.  Neck: Normal range of motion. Neck supple.  Cardiovascular: Normal rate, regular rhythm, normal heart sounds and intact distal pulses.   Pulmonary/Chest: Effort normal and breath sounds normal. No respiratory distress. No wheezes. No rales.  Abdominal: Soft. Bowel sounds are normal. Exhibits no distension and no mass. There is no tenderness.  Musculoskeletal: Normal range of motion. Exhibits no edema.  Lymphadenopathy:    No cervical adenopathy.  Neurological: Alert and oriented to person, place, and time. Exhibits  normal muscle tone. Gait normal. Coordination normal.  Skin: Skin is warm and dry. No rash noted. Not diaphoretic. No erythema. No pallor.  Psychiatric: Mood, memory and judgment normal.  Vitals reviewed.  LABORATORY DATA: Lab Results  Component Value Date   WBC 12.1 (H) 06/02/2020   HGB 11.8 (L) 06/02/2020   HCT 37.2 06/02/2020   MCV 93.2 06/02/2020   PLT 390 06/02/2020      Chemistry      Component Value Date/Time   NA 138 06/04/2020 1019   K 4.5 06/04/2020 1019   CL 102 06/04/2020 1019   CO2 29 06/04/2020 1019   BUN 14 06/04/2020 1019   CREATININE 0.57 06/04/2020 1019   CREATININE 0.70 06/02/2020 1254      Component Value Date/Time   CALCIUM 9.5 06/04/2020 1019   ALKPHOS 110 06/02/2020 1254   AST 20 06/02/2020 1254   ALT 18 06/02/2020 1254   BILITOT 0.5 06/02/2020 1254       RADIOGRAPHIC STUDIES:  DG Chest 2 View  Result Date: 05/25/2020 CLINICAL DATA:  Asthma. EXAM: CHEST - 2 VIEW COMPARISON:  None. FINDINGS: Trachea is midline. Heart size normal. Masslike consolidation in the right lung base. There may be a tiny amount of loculated right pleural fluid. Left lung is clear. IMPRESSION:  1. Masslike consolidation in the right lung base is worrisome for malignancy. CT chest with contrast is recommended. This is a call report. 2. Probable tiny amount of loculated right pleural fluid. Electronically Signed   By: Lorin Picket M.D.   On: 05/25/2020 13:39   MR Brain W Wo Contrast  Result Date: 06/08/2020 CLINICAL DATA:  Lung cancer staging. EXAM: MRI HEAD WITHOUT AND WITH CONTRAST TECHNIQUE: Multiplanar, multiecho pulse sequences of the brain and surrounding structures were obtained without and with intravenous contrast. CONTRAST:  41m GADAVIST GADOBUTROL 1 MMOL/ML IV SOLN COMPARISON:  None. FINDINGS: Brain: There is no evidence of an acute infarct, intracranial hemorrhage, mass, midline shift, or extra-axial fluid collection. There is mild cerebral atrophy. T2  hyperintensities in the cerebral white matter bilaterally are nonspecific but compatible with mild chronic small vessel ischemic disease. Chronic lacunar infarcts are noted in the basal ganglia bilaterally. No abnormal enhancement is identified. Vascular: Major intracranial vascular flow voids are preserved. Skull and upper cervical spine: Unremarkable bone marrow signal. Sinuses/Orbits: Unremarkable orbits. Paranasal sinuses and mastoid air cells are clear. Other: None. IMPRESSION: 1. No evidence of intracranial metastases. 2. Mild chronic small vessel ischemic disease. Electronically Signed   By: ALogan BoresM.D.   On: 06/08/2020 12:31     ASSESSMENT/PLAN:   This is a very pleasant 65year old Caucasian female with ***. She presented with a large 8.2 x 10.3 x 6.1 cm right lower lobe lung mass which is encasing the lower lobe bronchus and pulmonary artery.  There is also right hilar and subcarinal lymphadenopathy.  There is also a 6 mm low attenuating lesion in the right lobe of the liver which is indeterminate. She was diagnosed in April 2022.   The patient was seen with Dr. MEarlie Servertoday.  Dr. MJulien Nordmannhad a lengthy discussion the patient about her current condition and recommended treatment options. Dr. MJulien Nordmannhad a lengthly discussion with the patient today about her current condition and treatment options. The patient was given the option of a referral to hospice/palliative vs. Treatment with systemic chemotherapy with _.  The patient is interested in proceeding with systemic chemotherapy.  She is expected to start her first dose of this treatment on __.  We discussed the adverse side effects of treatment including but not limited to alopecia, myelosuppression, nausea and vomiting, peripheral neuropathy, liver or renal dysfunction as well as immunotherapy mediated adverse effects.   I will arrange for the patient to have a chemoeducation class prior to receiving her first cycle of chemotherapy.    We will arrange for the patient to have a _ injection while in the clinic today.     I sent prescriptions for _ as well as Compazine 10 mg every 6 hours as needed for nausea.   The patient will follow-up in 2 weeks for a one-week follow-up visit after completing her first cycle of chemotherapy.  The patient was advised to call immediately if she has any concerning symptoms in the interval. The patient voices understanding of current disease status and treatment options and is in agreement with the current care plan. All questions were answered. The patient knows to call the clinic with any problems, questions or concerns. We can certainly see the patient much sooner if necessary      No orders of the defined types were placed in this encounter.    I spent {CHL ONC TIME VISIT - [id number]}counseling the patient face to face. The total time spent in the appointment  was {CHL ONC TIME VISIT - [id number].  Sabas Frett L Darreon Lutes, PA-C 06/09/20

## 2020-06-09 NOTE — Telephone Encounter (Signed)
Spoke with pt and informed her of Dr. Agustina Caroli message. Pt stated understanding. Nothing further needed at this time.

## 2020-06-10 ENCOUNTER — Encounter (HOSPITAL_COMMUNITY): Payer: Self-pay | Admitting: Emergency Medicine

## 2020-06-10 NOTE — Progress Notes (Signed)
Patient denies fever, no new cough or chest pain.  PCP - Dr Horald Pollen Cardiologist - n/a  Chest x-ray - 05/25/20 (2V), 05/27/20 CE EKG - n/a Stress Test - n/a ECHO - n/a Cardiac Cath - n/a  STOP now taking any Aspirin (unless otherwise instructed by your surgeon), Aleve, Naproxen, Ibuprofen, Motrin, Advil, Goody's, BC's, all herbal medications, fish oil, and all vitamins.   Coronavirus Screening Covid test on 06/08/20 was negative.  Patient verbalized understanding of instructions that were given via phone.

## 2020-06-11 ENCOUNTER — Telehealth: Payer: Self-pay | Admitting: *Deleted

## 2020-06-11 ENCOUNTER — Encounter (HOSPITAL_COMMUNITY)
Admission: RE | Disposition: A | Discharge status: Home / Self Care | Payer: Self-pay | Source: Home / Self Care | Attending: Emergency Medicine

## 2020-06-11 ENCOUNTER — Ambulatory Visit (HOSPITAL_COMMUNITY)
Admission: RE | Admit: 2020-06-11 | Discharge: 2020-06-11 | Disposition: A | Discharge status: Home / Self Care | Payer: Medicare Other | Attending: Emergency Medicine | Admitting: Emergency Medicine

## 2020-06-11 ENCOUNTER — Ambulatory Visit (HOSPITAL_COMMUNITY): Payer: Medicare Other | Admitting: Certified Registered"

## 2020-06-11 ENCOUNTER — Encounter (HOSPITAL_COMMUNITY): Payer: Self-pay | Admitting: Emergency Medicine

## 2020-06-11 ENCOUNTER — Other Ambulatory Visit: Payer: Self-pay

## 2020-06-11 DIAGNOSIS — K219 Gastro-esophageal reflux disease without esophagitis: Secondary | ICD-10-CM | POA: Diagnosis not present

## 2020-06-11 DIAGNOSIS — J449 Chronic obstructive pulmonary disease, unspecified: Secondary | ICD-10-CM | POA: Diagnosis not present

## 2020-06-11 DIAGNOSIS — M1990 Unspecified osteoarthritis, unspecified site: Secondary | ICD-10-CM | POA: Diagnosis not present

## 2020-06-11 DIAGNOSIS — Z79899 Other long term (current) drug therapy: Secondary | ICD-10-CM | POA: Insufficient documentation

## 2020-06-11 DIAGNOSIS — R590 Localized enlarged lymph nodes: Secondary | ICD-10-CM

## 2020-06-11 DIAGNOSIS — R918 Other nonspecific abnormal finding of lung field: Secondary | ICD-10-CM | POA: Diagnosis not present

## 2020-06-11 DIAGNOSIS — Z885 Allergy status to narcotic agent status: Secondary | ICD-10-CM | POA: Insufficient documentation

## 2020-06-11 DIAGNOSIS — C771 Secondary and unspecified malignant neoplasm of intrathoracic lymph nodes: Secondary | ICD-10-CM | POA: Diagnosis not present

## 2020-06-11 DIAGNOSIS — C3431 Malignant neoplasm of lower lobe, right bronchus or lung: Secondary | ICD-10-CM | POA: Diagnosis not present

## 2020-06-11 DIAGNOSIS — Z87891 Personal history of nicotine dependence: Secondary | ICD-10-CM | POA: Diagnosis not present

## 2020-06-11 HISTORY — PX: BRONCHIAL BIOPSY: SHX5109

## 2020-06-11 HISTORY — DX: Dyspnea, unspecified: R06.00

## 2020-06-11 HISTORY — PX: BRONCHIAL NEEDLE ASPIRATION BIOPSY: SHX5106

## 2020-06-11 HISTORY — PX: BRONCHIAL BRUSHINGS: SHX5108

## 2020-06-11 HISTORY — DX: Other nonspecific abnormal finding of lung field: R91.8

## 2020-06-11 HISTORY — DX: Unspecified asthma, uncomplicated: J45.909

## 2020-06-11 HISTORY — DX: Anxiety disorder, unspecified: F41.9

## 2020-06-11 HISTORY — PX: VIDEO BRONCHOSCOPY WITH ENDOBRONCHIAL ULTRASOUND: SHX6177

## 2020-06-11 HISTORY — PX: BRONCHIAL WASHINGS: SHX5105

## 2020-06-11 HISTORY — DX: Chronic obstructive pulmonary disease, unspecified: J44.9

## 2020-06-11 SURGERY — BRONCHOSCOPY, WITH EBUS
Anesthesia: General

## 2020-06-11 MED ORDER — LACTATED RINGERS IV SOLN: INTRAVENOUS | Status: DC

## 2020-06-11 MED ORDER — PROPOFOL 10 MG/ML IV BOLUS
INTRAVENOUS | Status: DC | PRN
  Administered 2020-06-11: 130 mg via INTRAVENOUS

## 2020-06-11 MED ORDER — PHENYLEPHRINE HCL-NACL 10-0.9 MG/250ML-% IV SOLN
INTRAVENOUS | Status: DC | PRN
  Administered 2020-06-11: 50 ug/min via INTRAVENOUS

## 2020-06-11 MED ORDER — ACETAMINOPHEN 10 MG/ML IV SOLN
INTRAVENOUS | Status: DC | PRN
  Administered 2020-06-11: 1000 mg via INTRAVENOUS

## 2020-06-11 MED ORDER — ONDANSETRON HCL 4 MG/2ML IJ SOLN
INTRAMUSCULAR | Status: DC | PRN
  Administered 2020-06-11: 4 mg via INTRAVENOUS

## 2020-06-11 MED ORDER — PHENYLEPHRINE 40 MCG/ML (10ML) SYRINGE FOR IV PUSH (FOR BLOOD PRESSURE SUPPORT)
PREFILLED_SYRINGE | INTRAVENOUS | Status: DC | PRN
  Administered 2020-06-11: 80 ug via INTRAVENOUS
  Administered 2020-06-11 (×2): 200 ug via INTRAVENOUS

## 2020-06-11 MED ORDER — ACETAMINOPHEN 10 MG/ML IV SOLN
INTRAVENOUS | Status: AC
  Filled 2020-06-11: qty 100

## 2020-06-11 MED ORDER — ROCURONIUM BROMIDE 10 MG/ML (PF) SYRINGE
PREFILLED_SYRINGE | INTRAVENOUS | Status: DC | PRN
  Administered 2020-06-11: 50 mg via INTRAVENOUS

## 2020-06-11 MED ORDER — SODIUM CHLORIDE (PF) 0.9 % IJ SOLN
INTRAMUSCULAR | Status: DC | PRN
  Administered 2020-06-11: 6 mL

## 2020-06-11 MED ORDER — CHLORHEXIDINE GLUCONATE 0.12 % MT SOLN: 15.0000 mL | OROMUCOSAL | Status: AC

## 2020-06-11 MED ORDER — MIDAZOLAM HCL 2 MG/2ML IJ SOLN
INTRAMUSCULAR | Status: DC | PRN
  Administered 2020-06-11: 2 mg via INTRAVENOUS

## 2020-06-11 MED ORDER — CHLORHEXIDINE GLUCONATE 0.12 % MT SOLN
OROMUCOSAL | Status: AC
  Administered 2020-06-11: 15 mL via OROMUCOSAL
  Filled 2020-06-11: qty 15

## 2020-06-11 MED ORDER — FENTANYL CITRATE (PF) 100 MCG/2ML IJ SOLN: 25.0000 ug | INTRAMUSCULAR | Status: DC | PRN

## 2020-06-11 MED ORDER — SUGAMMADEX SODIUM 200 MG/2ML IV SOLN
INTRAVENOUS | Status: DC | PRN
  Administered 2020-06-11: 140 mg via INTRAVENOUS

## 2020-06-11 MED ORDER — LIDOCAINE 2% (20 MG/ML) 5 ML SYRINGE
INTRAMUSCULAR | Status: DC | PRN
  Administered 2020-06-11: 60 mg via INTRAVENOUS

## 2020-06-11 MED ORDER — DEXAMETHASONE SODIUM PHOSPHATE 10 MG/ML IJ SOLN
INTRAMUSCULAR | Status: DC | PRN
  Administered 2020-06-11: 10 mg via INTRAVENOUS

## 2020-06-11 MED ORDER — EPHEDRINE SULFATE-NACL 50-0.9 MG/10ML-% IV SOSY
PREFILLED_SYRINGE | INTRAVENOUS | Status: DC | PRN
  Administered 2020-06-11: 10 mg via INTRAVENOUS

## 2020-06-11 MED ORDER — FENTANYL CITRATE (PF) 250 MCG/5ML IJ SOLN
INTRAMUSCULAR | Status: DC | PRN
  Administered 2020-06-11: 50 ug via INTRAVENOUS

## 2020-06-11 NOTE — Interval H&P Note (Signed)
History and Physical Interval Note:  06/11/2020 8:38 AM  [name]  has presented today for surgery, with the diagnosis of lung mass.  The various methods of treatment have been discussed with the patient and family. After consideration of risks, benefits and other options for treatment, the patient has consented to  Procedure(s): [place] (N/A) as a surgical intervention.  The patient's history has been reviewed, patient examined, no change in status, stable for surgery.  I have reviewed the patient's chart and labs.  Questions were answered to the patient's satisfaction.     Collene Gobble

## 2020-06-11 NOTE — Telephone Encounter (Signed)
I called [name] to update on her schedule. She was in the recover room when I called. I update on appt but stated it will be on her AVS.

## 2020-06-11 NOTE — Anesthesia Preprocedure Evaluation (Signed)
Anesthesia Evaluation  Patient identified by MRN, date of birth, ID band Patient awake    Reviewed: Allergy & Precautions, H&P , NPO status , Patient's Chart, lab work & pertinent test results  Airway Mallampati: II  TM Distance: >3 FB Neck ROM: Full    Dental no notable dental hx. (+) Teeth Intact, Dental Advisory Given   Pulmonary shortness of breath and with exertion, asthma , COPD,  COPD inhaler, former smoker,    Pulmonary exam normal breath sounds clear to auscultation       Cardiovascular negative cardio ROS   Rhythm:Regular Rate:Normal     Neuro/Psych Anxiety negative neurological ROS     GI/Hepatic negative GI ROS, Neg liver ROS,   Endo/Other  negative endocrine ROS  Renal/GU negative Renal ROS  negative genitourinary   Musculoskeletal  (+) Arthritis , Osteoarthritis,    Abdominal   Peds  Hematology negative hematology ROS (+)   Anesthesia Other Findings   Reproductive/Obstetrics negative OB ROS                             Anesthesia Physical Anesthesia Plan  ASA: II  Anesthesia Plan: General   Post-op Pain Management:    Induction: Intravenous  PONV Risk Score and Plan: 4 or greater and Ondansetron, Dexamethasone and Midazolam  Airway Management Planned: Oral ETT  Additional Equipment:   Intra-op Plan:   Post-operative Plan: Extubation in OR  Informed Consent: I have reviewed the patients History and Physical, chart, labs and discussed the procedure including the risks, benefits and alternatives for the proposed anesthesia with the patient or authorized representative who has indicated his/her understanding and acceptance.     Dental advisory given  Plan Discussed with: CRNA  Anesthesia Plan Comments:         Anesthesia Quick Evaluation

## 2020-06-11 NOTE — Anesthesia Procedure Notes (Signed)
Procedure Name: Intubation Date/Time: 06/11/2020 9:02 AM Performed by: Lance Coon, CRNA Pre-anesthesia Checklist: Patient identified, Emergency Drugs available, Suction available, Patient being monitored and Timeout performed Patient Re-evaluated:Patient Re-evaluated prior to induction Oxygen Delivery Method: Circle system utilized Preoxygenation: Pre-oxygenation with 100% oxygen Induction Type: IV induction Ventilation: Mask ventilation without difficulty Laryngoscope Size: Miller and 3 Grade View: Grade I Tube type: Oral Tube size: 8.5 mm Number of attempts: 1 Airway Equipment and Method: Stylet Placement Confirmation: ETT inserted through vocal cords under direct vision,  positive ETCO2 and breath sounds checked- equal and bilateral Secured at: 22 cm Tube secured with: Tape Dental Injury: Teeth and Oropharynx as per pre-operative assessment

## 2020-06-11 NOTE — Discharge Instructions (Signed)
Flexible Bronchoscopy, Care After This sheet gives you information about how to care for yourself after your test. Your doctor may also give you more specific instructions. If you have problems or questions, contact your doctor. Follow these instructions at home: Eating and drinking  Do not eat or drink anything (not even water) for 2 hours after your test, or until your numbing medicine (local anesthetic) wears off.  When your numbness is gone and your cough and gag reflexes have come back, you may: ? Eat only soft foods. ? Slowly drink liquids.  The day after the test, go back to your normal diet. Driving  Do not drive for 24 hours if you were given a medicine to help you relax (sedative).  Do not drive or use heavy machinery while taking prescription pain medicine. General instructions   Take over-the-counter and prescription medicines only as told by your doctor.  Return to your normal activities as told. Ask what activities are safe for you.  Do not use any products that have nicotine or tobacco in them. This includes cigarettes and e-cigarettes. If you need help quitting, ask your doctor.  Keep all follow-up visits as told by your doctor. This is important. It is very important if you had a tissue sample (biopsy) taken. Get help right away if:  You have shortness of breath that gets worse.  You get light-headed.  You feel like you are going to pass out (faint).  You have chest pain.  You cough up: ? More than a little blood. ? More blood than before. Summary  Do not eat or drink anything (not even water) for 2 hours after your test, or until your numbing medicine wears off.  Do not use cigarettes. Do not use e-cigarettes.  Get help right away if you have chest pain.  Please call our office for any questions or concerns.  [phone].  This information is not intended to replace advice given to you by your health care provider. Make sure you discuss any  questions you have with your health care provider. Document Released: 12/12/2008 Document Revised: 01/27/2017 Document Reviewed: 03/04/2016 Elsevier Patient Education  2020 [employer].

## 2020-06-11 NOTE — Op Note (Signed)
[facility] Cardiopulmonary Patient Name: [name] Date: 06/11/2020 MRN: [id number] Attending MD: Collene Gobble , MD Date of Birth: 02/28/1956 CSN: Finalized Age: 65 Admit Type: Outpatient Gender: Female Procedure:             Bronchoscopy Indications:           Right lower lobe mass, Mediastinal adenopathy Providers:             Collene Gobble, MD, Clyde Lundborg, RN, Darlene H.                         Rosana Hoes, Technician, Lance Coon, CRNA Referring MD:           Medicines:             Epinephrine 1 mg/10 mL topical 6 mL Complications:         No immediate complications Estimated Blood Loss:  Estimated blood loss: none. Procedure:             Pre-Anesthesia Assessment:                        - A History and Physical has been performed. Patient                         meds and allergies have been reviewed. The risks and                         benefits of the procedure and the sedation options and                         risks were discussed with the patient. All questions                         were answered and informed consent was obtained.                         Patient identification and proposed procedure were                         verified prior to the procedure by the physician in                         the pre-procedure area. Mental Status Examination:                         alert and oriented. Airway Examination: normal                         oropharyngeal airway. Respiratory Examination: clear                         to auscultation and poor air movement in the right                         lung. CV Examination: normal. ASA Grade Assessment: II                         - A patient with mild systemic disease. After  reviewing the risks and benefits, the patient was                         deemed in satisfactory condition to undergo the                         procedure. The anesthesia plan was to use general                          anesthesia. Immediately prior to administration of                         medications, the patient was re-assessed for adequacy                         to receive sedatives. The heart rate, respiratory                         rate, oxygen saturations, blood pressure, adequacy of                         pulmonary ventilation, and response to care were                         monitored throughout the procedure. The physical                         status of the patient was re-assessed after the                         procedure.                        After obtaining informed consent, the bronchoscope was                         passed under direct vision. Throughout the procedure,                         the patient's blood pressure, pulse, and oxygen                         saturations were monitored continuously. the                         [place] [id number] was introduced through the                         mouth, via the endotracheal tube (the patient was                         intubated for the procedure) and advanced to the                         tracheobronchial tree. the [id number] ([id number]) Olympus                         EBUS scope was introduced through the and advanced to  the. The procedure was accomplished without                         difficulty. The patient tolerated the procedure well.                         The total duration of the procedure was 60 minutes. Scope In: Scope Out: Findings:      The trachea is of normal caliber. The carina is sharp. The       tracheobronchial tree of the left lung was examined to at least the       first subsegmental level. Bronchial mucosa and anatomy in the left lung       are normal; there are no endobronchial lesions, and no secretions.      Right Lung Abnormalities: A partially obstructing (about 90% obstructed)       mass was found proximally in the bronchus intermedius. The  mass was       medium-sized and endobronchial, exophytic, friable and raised. The       lesion was not traversed. Endobronchial biopsies were performed in the       bronchus intermedius using forceps and sent for histopathology       examination. Five samples were obtained. Guided brushings were obtained       in the bronchus intermedius with a cytology brush and sent for routine       cytology. One sample was obtained.      An endobronchial ultrasound endoscope was utilized in the left and right       paratracheal and subcarinal areas, in the right hilum and in the left       hilum.      Transbronchial needle aspirations of nodes were performed in the left       and right paratracheal and subcarinal areas, in the right hilum and in       the left hilum using a Wang 19 gauge needle and sent for routine       cytology. The procedure was guided by ultrasound. Samples were obtained       from [place], 7, 4R and 11R. There was an identifiable node at       Station 4L but it was not accessable for needle biopsy. Transbronchial       needle aspiration technique was selected because the sampling site was       not accessible using standard endoscopic (bronchoscopic) techniques. Impression:            - Right lower lobe mass                        - Mediastinal adenopathy                        - The airway examination of the left lung was normal.                        - An endobronchial, exophytic, friable and raised mass                         was found in the bronchus intermedius. This lesion is  likely malignant.                        - Endobronchial biopsies were performed in the BI.                        - Brushings were obtained from the BI.                        - Endobronchial ultrasound was performed to stage the                         mediastinum.                        - Transbronchial needle aspiration was performed at                          [place], 7, 4R and 11R. Moderate Sedation:      Performed under general anesthesia Recommendation:        - Await biopsy, brushing and cytology results. Procedure Code(s):     --- Professional ---                        [phone], Bronchoscopy, rigid or flexible, including                         fluoroscopic guidance, when performed; with                         transbronchial needle aspiration biopsy(s), trachea,                         main stem and/or lobar bronchus(i)                        95093, 59, Bronchoscopy, rigid or flexible, including                         fluoroscopic guidance, when performed; with bronchial                         or endobronchial biopsy(s), single or multiple sites                        26712, Bronchoscopy, rigid or flexible, including                         fluoroscopic guidance, when performed; with brushing                         or protected brushings                        31654, Bronchoscopy, rigid or flexible, including                         fluoroscopic guidance, when performed; with                         transendoscopic endobronchial ultrasound (EBUS) during  bronchoscopic diagnostic or therapeutic                         intervention(s) for peripheral lesion(s) (List                         separately in addition to code for primary                         procedure[s]) Diagnosis Code(s):     --- Professional ---                        R91.8, Other nonspecific abnormal finding of lung field                        R59.0, Localized enlarged lymph nodes CPT copyright 2019 [employer]. All rights reserved. The codes documented in this report are preliminary and upon coder review may  be revised to meet current compliance requirements. Collene Gobble, MD Collene Gobble, MD 06/11/2020 10:14:58 AM Number of Addenda: 0

## 2020-06-11 NOTE — Anesthesia Postprocedure Evaluation (Signed)
Anesthesia Post Note  Patient: [name]  Procedure(s) Performed: VIDEO BRONCHOSCOPY WITH ENDOBRONCHIAL ULTRASOUND (N/A )     Patient location during evaluation: PACU Anesthesia Type: General Level of consciousness: awake and alert Pain management: pain level controlled Vital Signs Assessment: post-procedure vital signs reviewed and stable Respiratory status: spontaneous breathing, nonlabored ventilation and respiratory function stable Cardiovascular status: blood pressure returned to baseline and stable Postop Assessment: no apparent nausea or vomiting Anesthetic complications: no   No complications documented.  Last Vitals:  Vitals:   06/11/20 1055 06/11/20 1110  BP: (!) 90/51 (!) 95/57  Pulse: (!) 104 99  Resp: 14 11  Temp:  36.7 C  SpO2: 97% 98%    Last Pain:  Vitals:   06/11/20 1110  PainSc: 0-No pain                 Bryann Mcnealy,W. EDMOND

## 2020-06-11 NOTE — Transfer of Care (Signed)
Immediate Anesthesia Transfer of Care Note  Patient: [name]  Procedure(s) Performed: VIDEO BRONCHOSCOPY WITH ENDOBRONCHIAL ULTRASOUND (N/A )  Patient Location: PACU  Anesthesia Type:General  Level of Consciousness: drowsy and patient cooperative  Airway & Oxygen Therapy: Patient Spontanous Breathing  Post-op Assessment: Report given to RN and Post -op Vital signs reviewed and stable  Post vital signs: Reviewed and stable  Last Vitals:  Vitals Value Taken Time  BP    Temp    Pulse 100 06/11/20 1008  Resp    SpO2 99 % 06/11/20 1008  Vitals shown include unvalidated device data.  Last Pain:  Vitals:   06/11/20 0711  PainSc: 3       Patients Stated Pain Goal: 0 (15/18/34 3735)  Complications: No complications documented.

## 2020-06-12 ENCOUNTER — Encounter (HOSPITAL_COMMUNITY): Payer: Self-pay | Admitting: Emergency Medicine

## 2020-06-12 LAB — CYTOLOGY - NON PAP

## 2020-06-15 ENCOUNTER — Telehealth: Payer: Self-pay | Admitting: *Deleted

## 2020-06-15 ENCOUNTER — Telehealth: Payer: Self-pay | Admitting: Emergency Medicine

## 2020-06-15 NOTE — Telephone Encounter (Signed)
I called to follow up with [name] regarding her appts.  She verbalized understanding of up coming appts.

## 2020-06-15 NOTE — Telephone Encounter (Signed)
Spoke with the patient to review her pathology results. Shows squamous cell lung CA. She has a PET scan this week and then Oncology follow up visit.

## 2020-06-16 ENCOUNTER — Ambulatory Visit: Payer: Medicare Other | Admitting: Physician Assistant

## 2020-06-16 NOTE — Progress Notes (Signed)
[facility] OFFICE PROGRESS NOTE  Emma Rasmussen, MD [place] [address]  DIAGNOSIS: Stage IIIB (T4, N2, M0) Non-Small Cell Lung Cancer, Squamous cell carcinoma.  She presented with a large centrally necrotic right upper lobe lung mass that encases the right hilum with postobstructive atelectasis in the right middle lobe.  There is also enlarged subcarinal lymphadenopathy and equivocal uptake in the right paratracheal and left prevascular lymph nodes.  There was also a separate posterior medial right upper lobe nodule. Unable to exclude involvement in the right hemidiaphragm. She was diagnosed in April 2022.  PRIOR THERAPY: None  CURRENT THERAPY: Systemic chemotherapy with carboplatin for an AUC of 5, paclitaxel 175 mg per metered squared, and Nivolumab 360 mg IV every 3 weeks first dose expected on 06/25/2020.  Expected to have a total of 4 cycles of chemo followed by either surgery or concurrent chemoradiation.  INTERVAL HISTORY: [name] 65 y.o. female returns to the clinic today for a follow-up visit accompanied by her husband.  The patient was recently found to have lung cancer pending further staging work-up with tissue diagnosis and imaging studies with a PET scan and brain MRI.  She was last seen in the clinic on 06/02/2020.  The patient has since seen Dr. Lamonte Sakai who performed a bronchoscopy on 06/11/2020 in which the final pathology was consistent with squamous cell carcinoma.  The patient noted that she has an appointment with surgeon, Loma Sender, at [facility] in May 2022  Overall, the patient is feeling fair but she is nevous about her condition. It has caused her to have some trouble sleeping which is understandable. Her main concern is the ongoing cough which is at the worst in the morning with associated post nasal drainage. She also notes she is loosing her appetite and certain smells cause an aversion to food. The patient recently  had a PET scan and brain MRI.  Patient is here today for evaluation and to review her imaging studies and for more detailed discussion about her current condition and recommended treatment options.  MEDICAL HISTORY: Past Medical History:  Diagnosis Date  . Anxiety   . Asthma   . Colon polyps   . COPD (chronic obstructive pulmonary disease) ([place])    patient denies this dx  . Dyspnea    with exertion  . Osteoarthritis   . Pneumonia   . Right lower lobe lung mass     ALLERGIES:  is allergic to morphine and related and percocet [oxycodone-acetaminophen].  MEDICATIONS:  Current Outpatient Medications  Medication Sig Dispense Refill  . albuterol (VENTOLIN HFA) 108 (90 Base) MCG/ACT inhaler Inhale 1-2 puffs into the lungs every 4 (four) hours as needed for wheezing or shortness of breath.    . ALPRAZolam (XANAX) 0.25 MG tablet Take 0.25 mg by mouth at bedtime as needed for sleep.    . ASHWAGANDHA PO Take 1 capsule by mouth daily.    . clonazePAM (KLONOPIN) 0.5 MG tablet Take 0.25-0.5 mg by mouth at bedtime as needed.    Marland Kitchen FLOVENT HFA 110 MCG/ACT inhaler Inhale into the lungs.    . fluticasone (FLONASE) 50 MCG/ACT nasal spray Place 1 spray into both nostrils daily as needed for allergies.    . folic acid (FOLVITE) 626 MCG tablet Take 400 mcg by mouth daily.    . Glucosamine-Chondroitin (OSTEO BI-FLEX REGULAR STRENGTH PO) Take 2 tablets by mouth daily.    Marland Kitchen ibuprofen (ADVIL) 200 MG tablet  Take 400 mg by mouth every 6 (six) hours as needed for headache or moderate pain.    Marland Kitchen lidocaine (XYLOCAINE) 2 % solution Use as directed in the mouth or throat every 6 (six) hours as needed (throat pain).    . mirtazapine (REMERON) 15 MG tablet Take 1 tablet (15 mg total) by mouth at bedtime. 30 tablet 2  . Multiple Minerals-Vitamins (MULTISOURCE CALCIUM MAG/D) TABS Take 1 tablet by mouth daily.    . Omega-3 Fatty Acids (FISH OIL) 1000 MG CAPS Take 1,000 mg by mouth daily.    . prochlorperazine  (COMPAZINE) 10 MG tablet Take 1 tablet (10 mg total) by mouth every 6 (six) hours as needed. 30 tablet 2  . TURMERIC PO Take 1 tablet by mouth daily.     No current facility-administered medications for this visit.    SURGICAL HISTORY:  Past Surgical History:  Procedure Laterality Date  . ABDOMINAL HYSTERECTOMY N/A 04/28/2015   Procedure: TOTAL HYSTERECTOMY ABDOMINAL;  Surgeon: Nunzio Cobbs, MD;  Location: [facility];  Service: Gynecology;  Laterality: N/A;  . BRONCHIAL BIOPSY  06/11/2020   Procedure: BRONCHIAL BIOPSIES;  Surgeon: Collene Gobble, MD;  Location: [facility] ENDOSCOPY;  Service: Cardiopulmonary;;  . BRONCHIAL BRUSHINGS  06/11/2020   Procedure: BRONCHIAL BRUSHINGS;  Surgeon: Collene Gobble, MD;  Location: [facility];  Service: Cardiopulmonary;;  . BRONCHIAL NEEDLE ASPIRATION BIOPSY  06/11/2020   Procedure: BRONCHIAL NEEDLE ASPIRATION BIOPSIES;  Surgeon: Collene Gobble, MD;  Location: [facility];  Service: Cardiopulmonary;;  . BRONCHIAL WASHINGS  06/11/2020   Procedure: BRONCHIAL WASHINGS;  Surgeon: Collene Gobble, MD;  Location: [facility] ENDOSCOPY;  Service: Cardiopulmonary;;  . BUNIONECTOMY Bilateral   . COLONOSCOPY    . rectal nodule removal  05/2012   --benign with Dr. Elmo Putt Thomas([facility])  . SALPINGOOPHORECTOMY Bilateral 04/28/2015   Procedure: BILATERAL SALPINGO OOPHORECTOMY WITH PELVIC WASHINGS ;  Surgeon: Nunzio Cobbs, MD;  Location: [facility];  Service: Gynecology;  Laterality: Bilateral;  . VIDEO BRONCHOSCOPY WITH ENDOBRONCHIAL ULTRASOUND N/A 06/11/2020   Procedure: VIDEO BRONCHOSCOPY WITH ENDOBRONCHIAL ULTRASOUND;  Surgeon: Collene Gobble, MD;  Location: [facility];  Service: Cardiopulmonary;  Laterality: N/A;  . WISDOM TOOTH EXTRACTION      REVIEW OF SYSTEMS:   Review of Systems  Constitutional: Positive for intermittent fatigue and weight loss. Negative for chills and fever.  HENT: Positive sore throat. Negative for mouth sores, nosebleeds, and trouble  swallowing.   Eyes: Negative for eye problems and icterus.  Respiratory: Positive for cough. Negative for shortness of breath and wheezing.   Cardiovascular: Positive for chest soreness with coughing. Negative for leg swelling.  Gastrointestinal: Positive for nausea. Negative for abdominal pain, constipation, diarrhea,  and vomiting.  Genitourinary: Negative for bladder incontinence, difficulty urinating, dysuria, frequency and hematuria.   Musculoskeletal: Negative for back pain, gait problem, neck pain and neck stiffness.  Skin: Negative for itching and rash.  Neurological: Negative for dizziness, extremity weakness, gait problem, headaches, light-headedness and seizures.  Hematological: Negative for adenopathy. Does not bruise/bleed easily.  Psychiatric/Behavioral: Positive for trouble sleeping. Negative for confusion and depression. The patient is not nervous/anxious.     PHYSICAL EXAMINATION:  Blood pressure 125/65, pulse 100, temperature 97.6 F (36.4 C), temperature source Tympanic, resp. rate 18, height 5\' 3"  (1.6 m), weight 135 lb 6.4 oz (61.4 kg), last menstrual period 02/28/2009, SpO2 97 %.  ECOG PERFORMANCE STATUS: 1 - Symptomatic but completely ambulatory  Physical Exam  Constitutional: Oriented to  person, place, and time and well-developed, well-nourished, and in no distress.  HENT:  Head: Normocephalic and atraumatic.  Mouth/Throat: Oropharynx is clear and moist. No oropharyngeal exudate.  Eyes: Conjunctivae are normal. Right eye exhibits no discharge. Left eye exhibits no discharge. No scleral icterus.  Neck: Normal range of motion. Neck supple.  Cardiovascular: Normal rate, regular rhythm, normal heart sounds and intact distal pulses.   Pulmonary/Chest: Effort normal. Decreased breath sounds in RLL. No respiratory distress. No wheezes. No rales.  Abdominal: Soft. Bowel sounds are normal. Exhibits no distension and no mass. There is no tenderness.  Musculoskeletal: Normal  range of motion. Exhibits no edema.  Lymphadenopathy:    No cervical adenopathy.  Neurological: Alert and oriented to person, place, and time. Exhibits normal muscle tone. Gait normal. Coordination normal.  Skin: Skin is warm and dry. No rash noted. Not diaphoretic. No erythema. No pallor.  Psychiatric: Mood, memory and judgment normal.  Vitals reviewed.  LABORATORY DATA: Lab Results  Component Value Date   WBC 12.1 (H) 06/02/2020   HGB 11.8 (L) 06/02/2020   HCT 37.2 06/02/2020   MCV 93.2 06/02/2020   PLT 390 06/02/2020      Chemistry      Component Value Date/Time   NA 138 06/04/2020 1019   K 4.5 06/04/2020 1019   CL 102 06/04/2020 1019   CO2 29 06/04/2020 1019   BUN 14 06/04/2020 1019   CREATININE 0.57 06/04/2020 1019   CREATININE 0.70 06/02/2020 1254      Component Value Date/Time   CALCIUM 9.5 06/04/2020 1019   ALKPHOS 110 06/02/2020 1254   AST 20 06/02/2020 1254   ALT 18 06/02/2020 1254   BILITOT 0.5 06/02/2020 1254       RADIOGRAPHIC STUDIES:  DG Chest 2 View  Result Date: 05/25/2020 CLINICAL DATA:  Asthma. EXAM: CHEST - 2 VIEW COMPARISON:  None. FINDINGS: Trachea is midline. Heart size normal. Masslike consolidation in the right lung base. There may be a tiny amount of loculated right pleural fluid. Left lung is clear. IMPRESSION: 1. Masslike consolidation in the right lung base is worrisome for malignancy. CT chest with contrast is recommended. This is a call report. 2. Probable tiny amount of loculated right pleural fluid. Electronically Signed   By: Lorin Picket M.D.   On: 05/25/2020 13:39   MR Brain W Wo Contrast  Result Date: 06/08/2020 CLINICAL DATA:  Lung cancer staging. EXAM: MRI HEAD WITHOUT AND WITH CONTRAST TECHNIQUE: Multiplanar, multiecho pulse sequences of the brain and surrounding structures were obtained without and with intravenous contrast. CONTRAST:  1mL GADAVIST GADOBUTROL 1 MMOL/ML IV SOLN COMPARISON:  None. FINDINGS: Brain: There is no  evidence of an acute infarct, intracranial hemorrhage, mass, midline shift, or extra-axial fluid collection. There is mild cerebral atrophy. T2 hyperintensities in the cerebral white matter bilaterally are nonspecific but compatible with mild chronic small vessel ischemic disease. Chronic lacunar infarcts are noted in the basal ganglia bilaterally. No abnormal enhancement is identified. Vascular: Major intracranial vascular flow voids are preserved. Skull and upper cervical spine: Unremarkable bone marrow signal. Sinuses/Orbits: Unremarkable orbits. Paranasal sinuses and mastoid air cells are clear. Other: None. IMPRESSION: 1. No evidence of intracranial metastases. 2. Mild chronic small vessel ischemic disease. Electronically Signed   By: Logan Bores M.D.   On: 06/08/2020 12:31   NM PET Image Initial (PI) Skull Base To Thigh  Result Date: 06/18/2020 CLINICAL DATA:  Initial treatment strategy for Lung cancer. EXAM: NUCLEAR MEDICINE PET SKULL BASE TO  THIGH TECHNIQUE: 6.8 mCi F-18 FDG was injected intravenously. Full-ring PET imaging was performed from the skull base to thigh after the radiotracer. CT data was obtained and used for attenuation correction and anatomic localization. Fasting blood glucose: 116 mg/dl COMPARISON:  None. FINDINGS: Mediastinal blood pool activity: SUV max 2.4 Liver activity: SUV max NA NECK: No hypermetabolic lymph nodes in the neck. Incidental CT findings: none CHEST: Large centrally necrotic mass involving the right lower lobe measures 11.8 cm in maximum dimension with SUV max of 21.55, image 45/8. The mass involves the right hilum with postobstructive changes including complete atelectasis of the right middle lobe. Further, the mass appears intimately associated with the right hemidiaphragm. Cannot exclude involvement of the right hemidiaphragm. FDG avid subcarinal lymph node measures 1.9 cm and has an SUV max of 15.17, image 65/4. Low right paratracheal lymph node has a short axis  of 8 mm with SUV max of 2.88. Small left pre-vascular lymph nodes exhibit mild FDG uptake just above background blood pool activity. Index lymph node measures 5 mm and has an SUV max of 2.54, image 57/4. Within the posteromedial right upper lobe there is a FDG avid nodule measuring 1 cm with SUV max of 5.79, image 24/8. Also in the right upper lobe is a 1 cm peribronchovascular nodule with SUV max of 1.0, image 24/8. No FDG avid nodules within the left hemithorax. Incidental CT findings: Aortic atherosclerosis. Coronary artery calcifications. Trace right pleural fluid posteriorly, image 59/4. ABDOMEN/PELVIS: No abnormal hypermetabolic activity within the liver, pancreas, adrenal glands, or spleen. No hypermetabolic lymph nodes in the abdomen or pelvis. Incidental CT findings: Aortic atherosclerosis.  No aneurysm. SKELETON: No focal hypermetabolic activity to suggest skeletal metastasis. Incidental CT findings: none IMPRESSION: 1. Intense FDG uptake is associated with the large, centrally necrotic right lower lobe lung mass. The mass encases the right hilum and there is postobstructive atelectasis of the right middle lobe. Enlarged FDG avid subcarinal lymph node is identified consistent with metastatic adenopathy. There is equivocal uptake within subcentimeter right paratracheal and left prevascular lymph nodes. Separate FDG avid nodule within the posteromedial right upper lobe is identified. Assuming non-small cell histology imaging findings are compatible with a T4N2M0 lesion or stage IIIB disease. 2. Cannot exclude involvement of the right hemidiaphragm. If clinically indicated consider contrast enhanced CT or MRI of the chest to assess involvement of the right hemidiaphragm. Electronically Signed   By: Kerby Moors M.D.   On: 06/18/2020 09:13     ASSESSMENT/PLAN:  This is a very pleasant 65 year old Caucasian female diagnosed with stage IIIB (T4, N2, M0) Non-Small Cell Lung Cancer, Squamous cell carcinoma.   She presented with a large centrally necrotic right upper lobe lung mass that encases the right hilum with postobstructive atelectasis in the right middle lobe.  There is also enlarged subcarinal lymphadenopathy and equivocal uptake in the right paratracheal and left prevascular lymph nodes.  There was also a separate posterior medial right upper lobe nodule. Unable to exclude involvement in the right hemidiaphragm. She was diagnosed in April 2022.  Dr. Julien Nordmann had a lengthly discussion with the patient today about her current condition and treatment options.  The patient's case was discussed at the multidisciplinary conference this morning.  The patient is likely not a surgical candidate at this time due to the size of the mass.  Standard of care for stage IIIb disease would be concurrent chemoradiation; however, given the size of the tumor, this would be a very large radiation field.  Therefore it was recommended that the patient undergo 3 cycles of systemic chemotherapy/immunotherapy with carboplatin for an AUC of 5, paclitaxel 175 mg per metered squared, nivolumab 360 mg IV every 3 weeks.  The patient is interested in this option and she is expected to receive her first dose of treatment next week on 06/26/2019.   After she completes 3 cycles of systemic chemotherapy, if a positive response to treatment we may proceed with concurrent chemoradiation or surgery if the surgeon [facility] thinks that she is a surgical candidate if we are able to downstage her disease.  We discussed the adverse side effects of treatment including but not limited to alopecia, myelosuppression, nausea and vomiting, peripheral neuropathy, liver or renal dysfunction as well as immunotherapy mediated adverse effects.   I will arrange for the patient to have a chemoeducation class prior to receiving her first cycle of chemotherapy.   I sent a prescription for Compazine 10 mg every 6 hours as needed for nausea.  The patient also  wanted a medication to help with her appetite.  Due to the patient's decreased appetite and insomnia and anxiety over her new diagnosis she was sent in a prescription for Remeron 15 mg p.o. nightly.  The patient will follow-up in 2 weeks for a one-week follow-up visit after completing her first cycle of chemotherapy.  The patient inquired about her PET scan images from today.  The patient is wanting to ensure that these images get transferred to her surgeon at the [facility].  We will work on getting her images available so that they can be seen at the [facility].  The patient was advised to call immediately if she has any concerning symptoms in the interval. The patient voices understanding of current disease status and treatment options and is in agreement with the current care plan. All questions were answered. The patient knows to call the clinic with any problems, questions or concerns. We can certainly see the patient much sooner if necessary     Orders Placed This Encounter  Procedures  . CBC with Differential (Cancer Center Only)    Standing Status:   Standing    Number of Occurrences:   9    Standing Expiration Date:   06/18/2021  . CMP ([facility] only)    Standing Status:   Standing    Number of Occurrences:   9    Standing Expiration Date:   06/18/2021  . TSH    Standing Status:   Standing    Number of Occurrences:   9    Standing Expiration Date:   06/18/2021      Tobe Sos Sutton Plake, PA-C 06/18/20  ADDENDUM: Hematology/Oncology Attending: I had a face-to-face encounter with the patient today.  I reviewed her record, scans and recommended her care plan.  This is a very pleasant 65 years old white female recently diagnosed with stage IIIB (T4, N2, M0) non-small cell lung cancer, squamous cell carcinoma, presented with large centrally necrotic right upper lobe lung mass encasing the right hilum and postobstructive atelectasis in the right middle  lobe in addition to enlarging subcarinal lymphadenopathy and suspicious right paratracheal and left prevascular lymph nodes diagnosed in April 2022. I had a lengthy discussion with the patient today about her current condition and treatment options.  I reviewed the PET scan and MRI of the brain and showed the images to the patient and her husband today. I discussed with the patient her treatment options and unfortunately  she is not a good surgical candidate at this point according to cardiothoracic surgery, Dr. Roxan Hockey as we discussed her case earlier today at the weekly thoracic conference. The patient also has a bulky disease in the lung and I recommended for her consideration of neoadjuvant systemic chemotherapy with carboplatin for AUC of 5, paclitaxel 175 Mg/M2 and nivolumab 360 mg IV every 3 weeks for 3 cycles.  This will be followed by restaging scan and evaluation for either surgical resection or concurrent chemoradiation.  Per discussion with Dr. Tammi Klippel from radiation oncology it was felt that the field of radiation will be large and could result in a lot of toxicity and fibrosis in this patient at this point.  It was preferred to proceed with the neoadjuvant chemoimmunotherapy for now. I discussed with the patient the adverse effect of this treatment including but not limited to alopecia, myelosuppression, nausea and vomiting, peripheral neuropathy, liver or renal dysfunction as well as immunotherapy adverse effects. The patient and her husband agreed to the current plan. She is expected to start the first cycle of this treatment next week. She will have a chemotherapy education class before the first treatment. She was advised to call immediately if she has any concerning symptoms in the interval. The total time spent in the appointment was 55 minutes. Disclaimer: This note was dictated with voice recognition software. Similar sounding words can inadvertently be transcribed and may be  missed upon review. Eilleen Kempf, MD 06/18/20

## 2020-06-17 ENCOUNTER — Telehealth: Payer: Self-pay | Admitting: Emergency Medicine

## 2020-06-17 ENCOUNTER — Encounter (HOSPITAL_COMMUNITY): Payer: Self-pay

## 2020-06-17 ENCOUNTER — Encounter (HOSPITAL_COMMUNITY): Admission: RE | Admit: 2020-06-17 | Payer: Medicare Other | Source: Ambulatory Visit

## 2020-06-17 ENCOUNTER — Telehealth: Payer: Self-pay | Admitting: Medical Oncology

## 2020-06-17 NOTE — Telephone Encounter (Signed)
Called and spoke with pt who states that she has had the cough for months but states within the last week, when coughing, she is unable to get the phlegm up as the phlegm is thick and makes her gag/choke at times due to how thick it is.  Pt said the cough is worse in the mornings.  Pt said that she was taking mucinex but then stopped taking it. She wanted to know if we thought that she might should go back to taking mucinex and I stated to her that mucinex should help loosen the phlegm and make it easier for her to be able to get up so stated to her to resume taking it and she verbalized understanding. Stated to pt if she was still not getting any relief or if symptoms worsened for her to call office back and she verbalized understanding. Nothing further needed.

## 2020-06-17 NOTE — Telephone Encounter (Signed)
Does she need labs tomorrow.?  PET scan date changed to  tomorrow at 0700. Pt was assured the results would be ready for [facility] at her appt.tomorrow.  Pt notified via VM that she does not need labs tomorrow. appt cancelled.

## 2020-06-18 ENCOUNTER — Other Ambulatory Visit: Payer: Self-pay | Admitting: Internal Medicine

## 2020-06-18 ENCOUNTER — Ambulatory Visit (HOSPITAL_COMMUNITY)
Admission: RE | Admit: 2020-06-18 | Discharge: 2020-06-18 | Disposition: A | Discharge status: Home / Self Care | Payer: Medicare Other | Source: Ambulatory Visit | Attending: Physician Assistant | Admitting: Physician Assistant

## 2020-06-18 ENCOUNTER — Inpatient Hospital Stay: Payer: Medicare Other

## 2020-06-18 ENCOUNTER — Inpatient Hospital Stay: Payer: Medicare Other | Admitting: Physician Assistant

## 2020-06-18 ENCOUNTER — Encounter: Payer: Self-pay | Admitting: Physician Assistant

## 2020-06-18 ENCOUNTER — Other Ambulatory Visit: Payer: Self-pay

## 2020-06-18 VITALS — BP 125/65 | HR 100 | Temp 97.6°F | Resp 18 | Ht 63.0 in | Wt 135.4 lb

## 2020-06-18 DIAGNOSIS — C3490 Malignant neoplasm of unspecified part of unspecified bronchus or lung: Secondary | ICD-10-CM | POA: Diagnosis not present

## 2020-06-18 DIAGNOSIS — R634 Abnormal weight loss: Secondary | ICD-10-CM | POA: Diagnosis not present

## 2020-06-18 DIAGNOSIS — R630 Anorexia: Secondary | ICD-10-CM | POA: Diagnosis not present

## 2020-06-18 DIAGNOSIS — C3491 Malignant neoplasm of unspecified part of right bronchus or lung: Secondary | ICD-10-CM

## 2020-06-18 DIAGNOSIS — Z7189 Other specified counseling: Secondary | ICD-10-CM

## 2020-06-18 DIAGNOSIS — R918 Other nonspecific abnormal finding of lung field: Secondary | ICD-10-CM | POA: Diagnosis present

## 2020-06-18 DIAGNOSIS — Z5111 Encounter for antineoplastic chemotherapy: Secondary | ICD-10-CM | POA: Diagnosis not present

## 2020-06-18 DIAGNOSIS — C3431 Malignant neoplasm of lower lobe, right bronchus or lung: Secondary | ICD-10-CM | POA: Diagnosis not present

## 2020-06-18 LAB — GLUCOSE, CAPILLARY: Glucose-Capillary: 116 mg/dL — ABNORMAL HIGH (ref 70–99)

## 2020-06-18 MED ORDER — FLUDEOXYGLUCOSE F - 18 (FDG) INJECTION
7.2000 | Freq: Once | INTRAVENOUS | Status: AC | PRN
  Administered 2020-06-18: 6.86 via INTRAVENOUS

## 2020-06-18 MED ORDER — PROCHLORPERAZINE MALEATE 10 MG PO TABS: 10.0000 mg | ORAL_TABLET | Freq: Four times a day (QID) | ORAL | 2 refills | Status: DC | PRN

## 2020-06-18 MED ORDER — MIRTAZAPINE 15 MG PO TABS: 15.0000 mg | ORAL_TABLET | Freq: Every day | ORAL | 2 refills | Status: DC

## 2020-06-18 NOTE — Patient Instructions (Addendum)
-  There are two main categories of lung cancer, they are named based on the size of the cancer cell. One is called Non-Small cell lung cancer. The other type is Small Cell Lung Cancer -The sample (biopsy) that they took of your tumor was consistent with a subtype of Non-small cell lung cancer called Squamous Cell Carcinoma.  -We covered a lot of important information at your appointment today regarding what the treatment plan is moving forward. Here are the the main points that were discussed at your office visit with [place] today:  -The treatment that you will receive consists of two chemotherapy drugs, called Carboplatin and Paclitaxel (also referred to as Taxol) and One immunotherapy drug called Opdivo -We are planning on starting your treatment next week on 06/25/20 but before your start your treatment, I would like you to attend a Chemotherapy Education Class. This involves having you sit down with one of our nurse educators. She will discuss with you one-on-one more details about your treatment as well as general information about resources here at the cancer center.  -Your treatment will be given once every 3 weeks. We will check your labs once a week for the first ~5 treatments just to make sure that important components of your blood are in an acceptable range -You will need to return 2 days after your treatment to receive an injection. This injection is important because it boosts your infection fighting cells in your body and helps protect you from getting an infection.  -We will get a CT scan after 3 treatments to check on the progress of treatment  Medications:  -Compazine was sent to your pharmacy. This medication is for nausea. You may take this every 6 hours as needed if you feel nausous.  -Remeron is an anti-depressant but it helps with appetite and helps with insomnia. Take 1 tablet before bed.   Follow up:  -We will see you back for a follow up visit in about 2 weeks to see how your first  treatment went and to make sure you are not having any side effects from treatment.   If you need to contact our office, please do not hesitate, we are here to help. Our number is [phone]. When you call, ask to speak to Cassie's or Dr. Worthy Flank nurse.

## 2020-06-18 NOTE — Progress Notes (Signed)
START ON PATHWAY REGIMEN - Non-Small Cell Lung     A cycle is every 21 days:     Paclitaxel      Carboplatin   **Always confirm dose/schedule in your pharmacy ordering system**  Patient Characteristics: Preoperative or Nonsurgical Candidate (Clinical Staging), Stage III - Nonsurgical Candidate (Nonsquamous and Squamous), PS = 0, 1 Therapeutic Status: Preoperative or Nonsurgical Candidate (Clinical Staging) AJCC T Category: cT4 AJCC N Category: cN2 AJCC M Category: cM0 AJCC 8 Stage Grouping: IIIB ECOG Performance Status: 1 Intent of Therapy: Curative Intent, Discussed with Patient

## 2020-06-19 ENCOUNTER — Telehealth: Payer: Self-pay

## 2020-06-19 ENCOUNTER — Telehealth: Payer: Self-pay | Admitting: Internal Medicine

## 2020-06-19 ENCOUNTER — Encounter: Payer: Self-pay | Admitting: *Deleted

## 2020-06-19 NOTE — Telephone Encounter (Signed)
Pt [name] stating she has seen her dentist and was advised she has a tooth infection. She wants to know if it's ok to start antibiotics before she starts tx.  Discussed with Dr.Mohamed and he advised pt can take antibiotics.  I have called the pt back and advised as indicated and she expressed understanding of this information.

## 2020-06-19 NOTE — Telephone Encounter (Signed)
Scheduled per los. Called and spoke with patient. Confirmed appts. Informed patient I will give her a call back about next week start date for infusion and advised she may go to our [facility] location. Patient is ok with that

## 2020-06-19 NOTE — Progress Notes (Signed)
I received an email from our radiology dept stating Ms. Kallman's scans have been pushed into [facility] PACS system.  They also updated me that Ms. Hardgrove obtained a CD of her scans.

## 2020-06-22 ENCOUNTER — Telehealth: Payer: Self-pay | Admitting: Radiology

## 2020-06-22 ENCOUNTER — Telehealth: Payer: Self-pay | Admitting: *Deleted

## 2020-06-22 NOTE — Telephone Encounter (Signed)
[name] NSCLC - Customer service manager for the Discovery and Validation of Biomarkers for the Prediction, Diagnosis, and Management of Disease  06/22/20    4:15PM  PHONE CALL: Confirmed I was speaking with [name]. Introduced myself as the Scientist, physiological and reason for call is for potential participation in the above mentioned study. Informed patient of the purpose, overall schema, and reimbursement for participation. Patient expressed interest in participating. A visit has been set for tomorrow, Tuesday 4/26 just prior to patient education to review consent and possibly complete the lab draw if patient signs consent. Patient had questions concerning her upcoming scheduled visits as well as about getting her COVID booster. These questions have been forwarded to Norton Blizzard, RN, Nurse Navigator who patient has a visit with tomorrow afternoon after patient education. Patient was thanked for her time and opportunity to speak with her.   Carol Ada, RT(R)(T) Clinical Research Coordinator

## 2020-06-22 NOTE — Telephone Encounter (Signed)
I called Ms. [name] to update her of her navigator appt tomorrow. I was unable to reach but did leave a vm message with an update.

## 2020-06-23 ENCOUNTER — Other Ambulatory Visit: Payer: Self-pay | Admitting: Emergency Medicine

## 2020-06-23 ENCOUNTER — Telehealth: Payer: Self-pay | Admitting: Internal Medicine

## 2020-06-23 ENCOUNTER — Other Ambulatory Visit: Payer: Self-pay

## 2020-06-23 ENCOUNTER — Inpatient Hospital Stay: Payer: Medicare Other

## 2020-06-23 ENCOUNTER — Telehealth: Payer: Self-pay

## 2020-06-23 ENCOUNTER — Inpatient Hospital Stay: Payer: Medicare Other | Admitting: Radiology

## 2020-06-23 ENCOUNTER — Inpatient Hospital Stay (HOSPITAL_BASED_OUTPATIENT_CLINIC_OR_DEPARTMENT_OTHER): Payer: Medicare Other | Admitting: *Deleted

## 2020-06-23 DIAGNOSIS — C3491 Malignant neoplasm of unspecified part of right bronchus or lung: Secondary | ICD-10-CM

## 2020-06-23 DIAGNOSIS — C3431 Malignant neoplasm of lower lobe, right bronchus or lung: Secondary | ICD-10-CM

## 2020-06-23 LAB — RESEARCH LABS

## 2020-06-23 NOTE — Telephone Encounter (Signed)
Spoke with pt regarding her covid-19 booster request. Informed pt the MD Mohamed's office recommends getting the Covid-19 booster 1-2 weeks after treatment, per Cassie Heilingoetter, PA. Pt verbalizes understanding and is appreciative of call back. Pt verbalizes understanding of upcoming appointments.

## 2020-06-23 NOTE — Progress Notes (Signed)
   Oncology Nurse Navigator Flowsheets 06/23/2020  Abnormal Finding Date 05/27/2020  Confirmed Diagnosis Date 06/04/2020  Diagnosis Status Confirmed Diagnosis Complete  Planned Course of Treatment Chemotherapy;Targeted Therapy  Phase of Treatment Targeted Therapy  Chemotherapy Actual Start Date: 06/25/2020  Targeted Therapy Actual Start Date: 06/25/2020  Navigator Follow Up Date: 06/29/2020  Navigator Follow Up Reason: Chemotherapy  Navigator Location [facility]  Referral Date to RadOnc/MedOnc 05/28/2020  Navigator Encounter Type Clinic/[facility]  Treatment Initiated Date 06/25/2020  Patient Visit Type Other  Treatment Phase Pre-Tx/Tx Discussion  Barriers/Navigation Needs Education  Education Newly Diagnosed Cancer Education  Interventions Psycho-Social Support;Education;Coordination of Care  Acuity Level 2-Minimal Needs (1-2 Barriers Identified)  Coordination of Care Appts;Other  Education Method Verbal  Time Spent with Patient 60

## 2020-06-23 NOTE — Telephone Encounter (Signed)
Scheduled appt per 4/25 sch msg. Pt's husband is aware.

## 2020-06-23 NOTE — Research (Signed)
[id number] NSCLC - Customer service manager for the Discovery and Validation of Biomarkers for the Prediction, Diagnosis, and Management of Disease  06/23/20    1:20PM  INTRODUCTION: Patient [name] was identified by this clinical research coordinator as a potential candidate for the above listed study.  This Clinical Research Coordinator met with [name], [id number], on 06/23/20 in a manner and location that ensures patient privacy to discuss participation in the above listed research study.  Patient is Unaccompanied.  A copy of the informed consent document and separate HIPAA Authorization was provided to the patient.  Patient reads, speaks, and understands [place]. Patient was provided with the business card of this Coordinator and encouraged to contact the research team with any questions.  Approximately 15 minutes were spent with the patient reviewing the informed consent documents.  Patient was provided the option of taking informed consent documents home to review and was encouraged to review at their convenience with their support network, including other care providers. Patient is comfortable with making a decision regarding study participation today.Patient verified this is a one time blood collection study, and this coordinator confirmed this as correct. Patient had no further questions and signed consent (Date Approved: 10/04/2019, Valid until: 10/02/2020) and separate HIPAA Authorization (version 5, dated 02/13/2019) forms at 1:42PM. Patient was thanked for her time, then walked to lab to collect labs as per protocol. Patient was given the $50 [employer] gift card and signed confirmation of receipt of gift card. Patient was given a copy of the signed consent and HIPAA authorization documents.  BLOOD COLLECTION: Blood was collected at 1355 via fresh venipuncture by Bernita Raisin, Phlebotomist.   Carol Ada, RT(R)(T) Clinical Research Coordinator

## 2020-06-25 ENCOUNTER — Ambulatory Visit: Payer: Medicare Other

## 2020-06-25 ENCOUNTER — Other Ambulatory Visit: Payer: Self-pay

## 2020-06-25 ENCOUNTER — Other Ambulatory Visit: Payer: Medicare Other

## 2020-06-25 ENCOUNTER — Inpatient Hospital Stay: Payer: Medicare Other

## 2020-06-25 VITALS — BP 124/62 | HR 89 | Temp 98.8°F | Resp 17

## 2020-06-25 DIAGNOSIS — R634 Abnormal weight loss: Secondary | ICD-10-CM

## 2020-06-25 DIAGNOSIS — C3491 Malignant neoplasm of unspecified part of right bronchus or lung: Secondary | ICD-10-CM

## 2020-06-25 DIAGNOSIS — Z5111 Encounter for antineoplastic chemotherapy: Secondary | ICD-10-CM | POA: Diagnosis not present

## 2020-06-25 DIAGNOSIS — C3431 Malignant neoplasm of lower lobe, right bronchus or lung: Secondary | ICD-10-CM

## 2020-06-25 DIAGNOSIS — R630 Anorexia: Secondary | ICD-10-CM

## 2020-06-25 LAB — CMP (CANCER CENTER ONLY)
ALT: 10 U/L (ref 0–44)
AST: 13 U/L — ABNORMAL LOW (ref 15–41)
Albumin: 3.9 g/dL (ref 3.5–5.0)
Alkaline Phosphatase: 75 U/L (ref 38–126)
Anion gap: 8 (ref 5–15)
BUN: 16 mg/dL (ref 8–23)
CO2: 29 mmol/L (ref 22–32)
Calcium: 10.2 mg/dL (ref 8.9–10.3)
Chloride: 98 mmol/L (ref 98–111)
Creatinine: 0.6 mg/dL (ref 0.44–1.00)
GFR, Estimated: >60 mL/min (ref >60)
Glucose, Bld: 95 mg/dL (ref 70–99)
Potassium: 4.3 mmol/L (ref 3.5–5.1)
Sodium: 135 mmol/L (ref 135–145)
Total Bilirubin: 0.4 mg/dL (ref 0.3–1.2)
Total Protein: 7.1 g/dL (ref 6.5–8.1)

## 2020-06-25 LAB — CBC WITH DIFFERENTIAL (CANCER CENTER ONLY)
Abs Immature Granulocytes: 0.07 10*3/uL (ref 0.00–0.07)
Basophils Absolute: 0.1 10*3/uL (ref 0.0–0.1)
Basophils Relative: 1 %
Eosinophils Absolute: 0.3 10*3/uL (ref 0.0–0.5)
Eosinophils Relative: 2 %
HCT: 37.6 % (ref 36.0–46.0)
Hemoglobin: 11.9 g/dL — ABNORMAL LOW (ref 12.0–15.0)
Immature Granulocytes: 1 %
Lymphocytes Relative: 12 %
Lymphs Abs: 1.5 10*3/uL (ref 0.7–4.0)
MCH: 29.5 pg (ref 26.0–34.0)
MCHC: 31.6 g/dL (ref 30.0–36.0)
MCV: 93.1 fL (ref 80.0–100.0)
Monocytes Absolute: 1.4 10*3/uL — ABNORMAL HIGH (ref 0.1–1.0)
Monocytes Relative: 10 %
Neutro Abs: 9.7 10*3/uL — ABNORMAL HIGH (ref 1.7–7.7)
Neutrophils Relative %: 74 %
Platelet Count: 382 10*3/uL (ref 150–400)
RBC: 4.04 MIL/uL (ref 3.87–5.11)
RDW: 12.6 % (ref 11.5–15.5)
WBC Count: 13 10*3/uL — ABNORMAL HIGH (ref 4.0–10.5)
nRBC: 0 % (ref 0.0–0.2)

## 2020-06-25 LAB — TSH: TSH: 0.568 u[IU]/mL (ref 0.308–3.960)

## 2020-06-25 MED ORDER — SODIUM CHLORIDE 0.9 % IV SOLN
397.0000 mg | Freq: Once | INTRAVENOUS | Status: AC
  Administered 2020-06-25: 400 mg via INTRAVENOUS
  Filled 2020-06-25: qty 40

## 2020-06-25 MED ORDER — FAMOTIDINE IN NACL 20-0.9 MG/50ML-% IV SOLN
INTRAVENOUS | Status: AC
  Filled 2020-06-25: qty 50

## 2020-06-25 MED ORDER — SODIUM CHLORIDE 0.9 % IV SOLN
150.0000 mg | Freq: Once | INTRAVENOUS | Status: AC
  Administered 2020-06-25: 150 mg via INTRAVENOUS
  Filled 2020-06-25: qty 150

## 2020-06-25 MED ORDER — SODIUM CHLORIDE 0.9 % IV SOLN
360.0000 mg | Freq: Once | INTRAVENOUS | Status: AC
  Administered 2020-06-25: 360 mg via INTRAVENOUS
  Filled 2020-06-25: qty 12

## 2020-06-25 MED ORDER — DIPHENHYDRAMINE HCL 50 MG/ML IJ SOLN
INTRAMUSCULAR | Status: AC
  Filled 2020-06-25: qty 1

## 2020-06-25 MED ORDER — SODIUM CHLORIDE 0.9 % IV SOLN
175.0000 mg/m2 | Freq: Once | INTRAVENOUS | Status: AC
  Administered 2020-06-25: 288 mg via INTRAVENOUS
  Filled 2020-06-25: qty 48

## 2020-06-25 MED ORDER — SODIUM CHLORIDE 0.9 % IV SOLN
Freq: Once | INTRAVENOUS | Status: AC
  Filled 2020-06-25: qty 250

## 2020-06-25 MED ORDER — SODIUM CHLORIDE 0.9 % IV SOLN
10.0000 mg | Freq: Once | INTRAVENOUS | Status: AC
  Administered 2020-06-25: 10 mg via INTRAVENOUS
  Filled 2020-06-25: qty 10

## 2020-06-25 MED ORDER — DIPHENHYDRAMINE HCL 50 MG/ML IJ SOLN
50.0000 mg | Freq: Once | INTRAMUSCULAR | Status: AC
  Administered 2020-06-25: 50 mg via INTRAVENOUS

## 2020-06-25 MED ORDER — PALONOSETRON HCL INJECTION 0.25 MG/5ML
INTRAVENOUS | Status: AC
  Filled 2020-06-25: qty 5

## 2020-06-25 MED ORDER — PALONOSETRON HCL INJECTION 0.25 MG/5ML
0.2500 mg | Freq: Once | INTRAVENOUS | Status: AC
  Administered 2020-06-25: 0.25 mg via INTRAVENOUS

## 2020-06-25 MED ORDER — FAMOTIDINE IN NACL 20-0.9 MG/50ML-% IV SOLN
20.0000 mg | Freq: Once | INTRAVENOUS | Status: AC
  Administered 2020-06-25: 20 mg via INTRAVENOUS

## 2020-06-25 NOTE — Patient Instructions (Signed)
[facility]  Discharge Instructions: Thank you for choosing [facility] to provide your oncology and hematology care.   If you have a lab appointment with the [facility], please go directly to the [facility] and check in at the registration area.  Wear comfortable clothing and clothing appropriate for easy access to any Portacath or PICC line.   We strive to give you quality time with your provider. You may need to reschedule your appointment if you arrive late (15 or more minutes).  Arriving late affects you and other patients whose appointments are after yours.  Also, if you miss three or more appointments without notifying the office, you may be dismissed from the clinic at the provider's discretion.      For prescription refill requests, have your pharmacy contact our office and allow 72 hours for refills to be completed.    Today you received the following chemotherapy and/or immunotherapy agents Opdivo, Taxol and Carboplatin      To help prevent nausea and vomiting after your treatment, we encourage you to take your nausea medication as directed.  BELOW ARE SYMPTOMS THAT SHOULD BE REPORTED IMMEDIATELY: . *FEVER GREATER THAN 100.4 F (38 C) OR HIGHER . *CHILLS OR SWEATING . *NAUSEA AND VOMITING THAT IS NOT CONTROLLED WITH YOUR NAUSEA MEDICATION . *UNUSUAL SHORTNESS OF BREATH . *UNUSUAL BRUISING OR BLEEDING . *URINARY PROBLEMS (pain or burning when urinating, or frequent urination) . *BOWEL PROBLEMS (unusual diarrhea, constipation, pain near the anus) . TENDERNESS IN MOUTH AND THROAT WITH OR WITHOUT PRESENCE OF ULCERS (sore throat, sores in mouth, or a toothache) . UNUSUAL RASH, SWELLING OR PAIN  . UNUSUAL VAGINAL DISCHARGE OR ITCHING   Items with * indicate a potential emergency and should be followed up as soon as possible or go to the Emergency Department if any problems should occur.  Please show the CHEMOTHERAPY ALERT CARD or  IMMUNOTHERAPY ALERT CARD at check-in to the Emergency Department and triage nurse. Should you have questions after your visit or need to cancel or reschedule your appointment, please contact [facility]  [phone] and follow the prompts.  Office hours are 8:00 a.m. to 4:30 p.m. Monday - Friday. Please note that voicemails left after 4:00 p.m. may not be returned until the following business day.  We are closed weekends and major holidays. You have access to a nurse at all times for urgent questions. Please call the main number to the clinic [phone] and follow the prompts.  For any non-urgent questions, you may also contact your provider using MyChart. We now offer e-Visits for anyone 65 and older to request care online for non-urgent symptoms. For details visit [email or web address].   Also download the MyChart app! Go to the app store, search "MyChart", open the app, select [facility], and log in with your MyChart username and password.  Due to Covid, a mask is required upon entering the hospital/clinic. If you do not have a mask, one will be given to you upon arrival. For doctor visits, patients may have 1 support person aged 65 or older with them. For treatment visits, patients cannot have anyone with them due to current Covid guidelines and our immunocompromised population.

## 2020-06-27 ENCOUNTER — Other Ambulatory Visit: Payer: Self-pay

## 2020-06-27 ENCOUNTER — Inpatient Hospital Stay: Payer: Medicare Other

## 2020-06-27 VITALS — BP 120/69 | HR 106 | Temp 97.6°F | Resp 17

## 2020-06-27 DIAGNOSIS — Z5111 Encounter for antineoplastic chemotherapy: Secondary | ICD-10-CM | POA: Diagnosis not present

## 2020-06-27 MED ORDER — PEGFILGRASTIM-BMEZ 6 MG/0.6ML ~~LOC~~ SOSY
6.0000 mg | PREFILLED_SYRINGE | Freq: Once | SUBCUTANEOUS | Status: AC
  Administered 2020-06-27: 6 mg via SUBCUTANEOUS

## 2020-06-27 NOTE — Patient Instructions (Signed)
Pegfilgrastim injection What is this medicine? PEGFILGRASTIM (PEG fil gra stim) is a long-acting granulocyte colony-stimulating factor that stimulates the growth of neutrophils, a type of white blood cell important in the body's fight against infection. It is used to reduce the incidence of fever and infection in patients with certain types of cancer who are receiving chemotherapy that affects the bone marrow, and to increase survival after being exposed to high doses of radiation. This medicine may be used for other purposes; ask your health care provider or pharmacist if you have questions. COMMON BRAND NAME(S): Fulphila, Neulasta, Nyvepria, UDENYCA, Ziextenzo What should I tell my health care provider before I take this medicine? They need to know if you have any of these conditions:  kidney disease  latex allergy  ongoing radiation therapy  sickle cell disease  skin reactions to acrylic adhesives (On-Body Injector only)  an unusual or allergic reaction to pegfilgrastim, filgrastim, other medicines, foods, dyes, or preservatives  pregnant or trying to get pregnant  breast-feeding How should I use this medicine? This medicine is for injection under the skin. If you get this medicine at home, you will be taught how to prepare and give the pre-filled syringe or how to use the On-body Injector. Refer to the patient Instructions for Use for detailed instructions. Use exactly as directed. Tell your healthcare provider immediately if you suspect that the On-body Injector may not have performed as intended or if you suspect the use of the On-body Injector resulted in a missed or partial dose. It is important that you put your used needles and syringes in a special sharps container. Do not put them in a trash can. If you do not have a sharps container, call your pharmacist or healthcare provider to get one. Talk to your pediatrician regarding the use of this medicine in children. While this drug  may be prescribed for selected conditions, precautions do apply. Overdosage: If you think you have taken too much of this medicine contact a poison control center or emergency room at once. NOTE: This medicine is only for you. Do not share this medicine with others. What if I miss a dose? It is important not to miss your dose. Call your doctor or health care professional if you miss your dose. If you miss a dose due to an On-body Injector failure or leakage, a new dose should be administered as soon as possible using a single prefilled syringe for manual use. What may interact with this medicine? Interactions have not been studied. This list may not describe all possible interactions. Give your health care provider a list of all the medicines, herbs, non-prescription drugs, or dietary supplements you use. Also tell them if you smoke, drink alcohol, or use illegal drugs. Some items may interact with your medicine. What should I watch for while using this medicine? Your condition will be monitored carefully while you are receiving this medicine. You may need blood work done while you are taking this medicine. Talk to your health care provider about your risk of cancer. You may be more at risk for certain types of cancer if you take this medicine. If you are going to need a MRI, CT scan, or other procedure, tell your doctor that you are using this medicine (On-Body Injector only). What side effects may I notice from receiving this medicine? Side effects that you should report to your doctor or health care professional as soon as possible:  allergic reactions (skin rash, itching or hives, swelling of   the face, lips, or tongue)  back pain  dizziness  fever  pain, redness, or irritation at site where injected  pinpoint red spots on the skin  red or dark-brown urine  shortness of breath or breathing problems  stomach or side pain, or pain at the shoulder  swelling  tiredness  trouble  passing urine or change in the amount of urine  unusual bruising or bleeding Side effects that usually do not require medical attention (report to your doctor or health care professional if they continue or are bothersome):  bone pain  muscle pain This list may not describe all possible side effects. Call your doctor for medical advice about side effects. You may report side effects to FDA at 1-800-FDA-1088. Where should I keep my medicine? Keep out of the reach of children. If you are using this medicine at home, you will be instructed on how to store it. Throw away any unused medicine after the expiration date on the label. NOTE: This sheet is a summary. It may not cover all possible information. If you have questions about this medicine, talk to your doctor, pharmacist, or health care provider.  2021 Elsevier/Gold Standard (2019-03-08 13:20:51)  

## 2020-06-29 ENCOUNTER — Telehealth: Payer: Self-pay | Admitting: *Deleted

## 2020-06-29 NOTE — Telephone Encounter (Signed)
Pt called & left message to return call asking about precautions she needs to take after chemo.   Returned call & informed of precautions-good hand washing, staying out of large crowds in small enclosed areas, avoiding sick visitors, etc. She also reports having some constipation & appetite issues.  She is drinking plenty & making protein smoothies.  She hasn't had a BM since Thurs.  Encouraged to continue fluids.  She took a dose of miralax yest & 2 Phillips stool softeners yest & today.  Encouraged to cont miralax & try Mag Citrate 1/2 bottle & repeat in 4 hours if no BM.  She expressed understanding.

## 2020-07-01 ENCOUNTER — Encounter: Payer: Self-pay | Admitting: Internal Medicine

## 2020-07-01 ENCOUNTER — Institutional Professional Consult (permissible substitution): Payer: Medicare Other | Admitting: Internal Medicine

## 2020-07-01 NOTE — Progress Notes (Signed)
Called pt to introduce myself as her Arboriculturist and to discuss the [employer].  Unfortunately there aren't any foundations offering copay assistance for her Dx and the type of ins she has.  I informed her of the [employer] and went over what it covers but she declined the grant at this time.  I will request for the registration staff give her my card in case she changes her mind and for any questions or concerns she may have in the future.

## 2020-07-02 ENCOUNTER — Other Ambulatory Visit: Payer: Self-pay

## 2020-07-02 ENCOUNTER — Inpatient Hospital Stay: Payer: Medicare Other | Attending: Physician Assistant | Admitting: Internal Medicine

## 2020-07-02 ENCOUNTER — Other Ambulatory Visit: Payer: Medicare Other

## 2020-07-02 ENCOUNTER — Inpatient Hospital Stay: Payer: Medicare Other

## 2020-07-02 VITALS — BP 136/77 | HR 105 | Temp 97.3°F | Resp 19 | Ht 63.0 in | Wt 134.0 lb

## 2020-07-02 DIAGNOSIS — Z79899 Other long term (current) drug therapy: Secondary | ICD-10-CM | POA: Diagnosis not present

## 2020-07-02 DIAGNOSIS — R630 Anorexia: Secondary | ICD-10-CM

## 2020-07-02 DIAGNOSIS — C3491 Malignant neoplasm of unspecified part of right bronchus or lung: Secondary | ICD-10-CM | POA: Diagnosis not present

## 2020-07-02 DIAGNOSIS — Z5111 Encounter for antineoplastic chemotherapy: Secondary | ICD-10-CM | POA: Diagnosis present

## 2020-07-02 DIAGNOSIS — Z90722 Acquired absence of ovaries, bilateral: Secondary | ICD-10-CM | POA: Diagnosis not present

## 2020-07-02 DIAGNOSIS — Z9079 Acquired absence of other genital organ(s): Secondary | ICD-10-CM | POA: Diagnosis not present

## 2020-07-02 DIAGNOSIS — C3411 Malignant neoplasm of upper lobe, right bronchus or lung: Secondary | ICD-10-CM | POA: Insufficient documentation

## 2020-07-02 DIAGNOSIS — Z5112 Encounter for antineoplastic immunotherapy: Secondary | ICD-10-CM | POA: Diagnosis not present

## 2020-07-02 DIAGNOSIS — R634 Abnormal weight loss: Secondary | ICD-10-CM

## 2020-07-02 DIAGNOSIS — Z5189 Encounter for other specified aftercare: Secondary | ICD-10-CM | POA: Insufficient documentation

## 2020-07-02 DIAGNOSIS — J449 Chronic obstructive pulmonary disease, unspecified: Secondary | ICD-10-CM | POA: Diagnosis not present

## 2020-07-02 DIAGNOSIS — C3431 Malignant neoplasm of lower lobe, right bronchus or lung: Secondary | ICD-10-CM

## 2020-07-02 DIAGNOSIS — Z90710 Acquired absence of both cervix and uterus: Secondary | ICD-10-CM | POA: Insufficient documentation

## 2020-07-02 LAB — CBC WITH DIFFERENTIAL (CANCER CENTER ONLY)
Abs Immature Granulocytes: 2.78 10*3/uL — ABNORMAL HIGH (ref 0.00–0.07)
Basophils Absolute: 0.1 10*3/uL (ref 0.0–0.1)
Basophils Relative: 0 %
Eosinophils Absolute: 0.4 10*3/uL (ref 0.0–0.5)
Eosinophils Relative: 1 %
HCT: 36.5 % (ref 36.0–46.0)
Hemoglobin: 11.5 g/dL — ABNORMAL LOW (ref 12.0–15.0)
Immature Granulocytes: 12 %
Lymphocytes Relative: 12 %
Lymphs Abs: 2.8 10*3/uL (ref 0.7–4.0)
MCH: 29 pg (ref 26.0–34.0)
MCHC: 31.5 g/dL (ref 30.0–36.0)
MCV: 91.9 fL (ref 80.0–100.0)
Monocytes Absolute: 3.3 10*3/uL — ABNORMAL HIGH (ref 0.1–1.0)
Monocytes Relative: 14 %
Neutro Abs: 14.9 10*3/uL — ABNORMAL HIGH (ref 1.7–7.7)
Neutrophils Relative %: 61 %
Platelet Count: 408 10*3/uL — ABNORMAL HIGH (ref 150–400)
RBC: 3.97 MIL/uL (ref 3.87–5.11)
RDW: 13 % (ref 11.5–15.5)
WBC Count: 24.2 10*3/uL — ABNORMAL HIGH (ref 4.0–10.5)
nRBC: 0.1 % (ref 0.0–0.2)

## 2020-07-02 LAB — CMP (CANCER CENTER ONLY)
ALT: 14 U/L (ref 0–44)
AST: 22 U/L (ref 15–41)
Albumin: 3.3 g/dL — ABNORMAL LOW (ref 3.5–5.0)
Alkaline Phosphatase: 121 U/L (ref 38–126)
Anion gap: 10 (ref 5–15)
BUN: 13 mg/dL (ref 8–23)
CO2: 27 mmol/L (ref 22–32)
Calcium: 9.7 mg/dL (ref 8.9–10.3)
Chloride: 100 mmol/L (ref 98–111)
Creatinine: 0.67 mg/dL (ref 0.44–1.00)
GFR, Estimated: >60 mL/min (ref >60)
Glucose, Bld: 109 mg/dL — ABNORMAL HIGH (ref 70–99)
Potassium: 4.3 mmol/L (ref 3.5–5.1)
Sodium: 137 mmol/L (ref 135–145)
Total Bilirubin: <0.2 mg/dL — ABNORMAL LOW (ref 0.3–1.2)
Total Protein: 6.8 g/dL (ref 6.5–8.1)

## 2020-07-02 MED ORDER — ONDANSETRON HCL 8 MG PO TABS: 8.0000 mg | ORAL_TABLET | Freq: Three times a day (TID) | ORAL | 0 refills | Status: DC | PRN

## 2020-07-02 NOTE — Addendum Note (Signed)
Addended by: Claretha Cooper on: 07/02/2020 09:27 AM   Modules accepted: Orders

## 2020-07-02 NOTE — Progress Notes (Signed)
[place] [phone]   [phone]  OFFICE PROGRESS NOTE  Hayden Rasmussen, MD [address] [place] [address]  DIAGNOSIS: Stage IIIB (T4, N2, M0) Non-Small Cell Lung Cancer, Squamous cell carcinoma.  She presented with a large centrally necrotic right upper lobe lung mass that encases the right hilum with postobstructive atelectasis in the right middle lobe.  There is also enlarged subcarinal lymphadenopathy and equivocal uptake in the right paratracheal and left prevascular lymph nodes.  There was also a separate posterior medial right upper lobe nodule. Unable to exclude involvement in the right hemidiaphragm. She was diagnosed in April 2022.  PRIOR THERAPY: None  CURRENT THERAPY: Neoadjuvant systemic chemotherapy with carboplatin for AUC of 5, paclitaxel 175 Mg/M2 and nivolumab 360 mg IV every 3 weeks.  First dose June 25, 2020.  Status post 1 cycle.  INTERVAL HISTORY: Emma ALLBRITTON 65 y.o. female returns to the clinic today for follow-up visit accompanied by her husband.  The patient tolerated the first cycle of her treatment fairly well except for fatigue and aching pain after the Neulasta injection.  She had mild nausea on Compazine makes her sleepy during the day.  She would like to have Zofran on hand.  She continues to have mild cough with blood-tinged sputum.  The patient has no chest pain, shortness of breath.  She has no nausea, vomiting, diarrhea or constipation.  She denied having any headache or visual changes.  She is here today for evaluation and repeat blood work.  MEDICAL HISTORY: Past Medical History:  Diagnosis Date  . Anxiety   . Asthma   . Colon polyps   . COPD (chronic obstructive pulmonary disease) ([place])    patient denies this dx  . Dyspnea    with exertion  . Osteoarthritis   . Pneumonia   . Right lower lobe lung mass     ALLERGIES:  is allergic to morphine and related and percocet  [oxycodone-acetaminophen].  MEDICATIONS:  Current Outpatient Medications  Medication Sig Dispense Refill  . albuterol (VENTOLIN HFA) 108 (90 Base) MCG/ACT inhaler Inhale 1-2 puffs into the lungs every 4 (four) hours as needed for wheezing or shortness of breath.    . ALPRAZolam (XANAX) 0.25 MG tablet Take 0.25 mg by mouth at bedtime as needed for sleep.    . ASHWAGANDHA PO Take 1 capsule by mouth daily.    . clonazePAM (KLONOPIN) 0.5 MG tablet Take 0.25-0.5 mg by mouth at bedtime as needed.    Marland Kitchen FLOVENT HFA 110 MCG/ACT inhaler Inhale into the lungs.    . fluticasone (FLONASE) 50 MCG/ACT nasal spray Place 1 spray into both nostrils daily as needed for allergies.    . folic acid (FOLVITE) 941 MCG tablet Take 400 mcg by mouth daily.    . Glucosamine-Chondroitin (OSTEO BI-FLEX REGULAR STRENGTH PO) Take 2 tablets by mouth daily.    Marland Kitchen ibuprofen (ADVIL) 200 MG tablet Take 400 mg by mouth every 6 (six) hours as needed for headache or moderate pain.    Marland Kitchen lidocaine (XYLOCAINE) 2 % solution Use as directed in the mouth or throat every 6 (six) hours as needed (throat pain).    . mirtazapine (REMERON) 15 MG tablet Take 1 tablet (15 mg total) by mouth at bedtime. 30 tablet 2  . Multiple Minerals-Vitamins (MULTISOURCE CALCIUM MAG/D) TABS Take 1 tablet by mouth daily.    . Omega-3 Fatty Acids (FISH OIL) 1000 MG CAPS Take 1,000  mg by mouth daily.    . prochlorperazine (COMPAZINE) 10 MG tablet Take 1 tablet (10 mg total) by mouth every 6 (six) hours as needed. 30 tablet 2  . TURMERIC PO Take 1 tablet by mouth daily.     No current facility-administered medications for this visit.    SURGICAL HISTORY:  Past Surgical History:  Procedure Laterality Date  . ABDOMINAL HYSTERECTOMY N/A 04/28/2015   Procedure: TOTAL HYSTERECTOMY ABDOMINAL;  Surgeon: Nunzio Cobbs, MD;  Location: [facility];  Service: Gynecology;  Laterality: N/A;  . BRONCHIAL BIOPSY  06/11/2020   Procedure: BRONCHIAL BIOPSIES;  Surgeon:  Collene Gobble, MD;  Location: [facility]COPY;  Service: Cardiopulmonary;;  . BRONCHIAL BRUSHINGS  06/11/2020   Procedure: BRONCHIAL BRUSHINGS;  Surgeon: Collene Gobble, MD;  Location: [facility];  Service: Cardiopulmonary;;  . BRONCHIAL NEEDLE ASPIRATION BIOPSY  06/11/2020   Procedure: BRONCHIAL NEEDLE ASPIRATION BIOPSIES;  Surgeon: Collene Gobble, MD;  Location: [facility];  Service: Cardiopulmonary;;  . BRONCHIAL WASHINGS  06/11/2020   Procedure: BRONCHIAL WASHINGS;  Surgeon: Collene Gobble, MD;  Location: [facility] ENDOSCOPY;  Service: Cardiopulmonary;;  . BUNIONECTOMY Bilateral   . COLONOSCOPY    . rectal nodule removal  05/2012   --benign with Dr. Elmo Putt Thomas([facility])  . SALPINGOOPHORECTOMY Bilateral 04/28/2015   Procedure: BILATERAL SALPINGO OOPHORECTOMY WITH PELVIC WASHINGS ;  Surgeon: Nunzio Cobbs, MD;  Location: [facility];  Service: Gynecology;  Laterality: Bilateral;  . VIDEO BRONCHOSCOPY WITH ENDOBRONCHIAL ULTRASOUND N/A 06/11/2020   Procedure: VIDEO BRONCHOSCOPY WITH ENDOBRONCHIAL ULTRASOUND;  Surgeon: Collene Gobble, MD;  Location: [facility];  Service: Cardiopulmonary;  Laterality: N/A;  . WISDOM TOOTH EXTRACTION      REVIEW OF SYSTEMS:  A comprehensive review of systems was negative except for: Constitutional: positive for fatigue Respiratory: positive for cough and sputum   PHYSICAL EXAMINATION: General appearance: alert, cooperative, fatigued and no distress Head: Normocephalic, without obvious abnormality, atraumatic Neck: no adenopathy, no JVD, supple, symmetrical, trachea midline and thyroid not enlarged, symmetric, no tenderness/mass/nodules Lymph nodes: Cervical, supraclavicular, and axillary nodes normal. Resp: clear to auscultation bilaterally Back: symmetric, no curvature. ROM normal. No CVA tenderness. Cardio: regular rate and rhythm, S1, S2 normal, no murmur, click, rub or gallop GI: soft, non-tender; bowel sounds normal; no masses,  no  organomegaly Extremities: extremities normal, atraumatic, no cyanosis or edema  ECOG PERFORMANCE STATUS: 1 - Symptomatic but completely ambulatory  Blood pressure 136/77, pulse (!) 105, temperature (!) 97.3 F (36.3 C), temperature source Tympanic, resp. rate 19, height 5\' 3"  (1.6 m), weight 134 lb (60.8 kg), last menstrual period 02/28/2009, SpO2 97 %.  LABORATORY DATA: Lab Results  Component Value Date   WBC 24.2 (H) 07/02/2020   HGB 11.5 (L) 07/02/2020   HCT 36.5 07/02/2020   MCV 91.9 07/02/2020   PLT 408 (H) 07/02/2020      Chemistry      Component Value Date/Time   NA 135 06/25/2020 1014   K 4.3 06/25/2020 1014   CL 98 06/25/2020 1014   CO2 29 06/25/2020 1014   BUN 16 06/25/2020 1014   CREATININE 0.60 06/25/2020 1014      Component Value Date/Time   CALCIUM 10.2 06/25/2020 1014   ALKPHOS 75 06/25/2020 1014   AST 13 (L) 06/25/2020 1014   ALT 10 06/25/2020 1014   BILITOT 0.4 06/25/2020 1014       RADIOGRAPHIC STUDIES: MR Brain W Wo Contrast  Result Date: 06/08/2020 CLINICAL DATA:  Lung cancer staging. EXAM: MRI HEAD WITHOUT AND WITH CONTRAST TECHNIQUE: Multiplanar, multiecho pulse sequences of the brain and surrounding structures were obtained without and with intravenous contrast. CONTRAST:  30mL GADAVIST GADOBUTROL 1 MMOL/ML IV SOLN COMPARISON:  None. FINDINGS: Brain: There is no evidence of an acute infarct, intracranial hemorrhage, mass, midline shift, or extra-axial fluid collection. There is mild cerebral atrophy. T2 hyperintensities in the cerebral white matter bilaterally are nonspecific but compatible with mild chronic small vessel ischemic disease. Chronic lacunar infarcts are noted in the basal ganglia bilaterally. No abnormal enhancement is identified. Vascular: Major intracranial vascular flow voids are preserved. Skull and upper cervical spine: Unremarkable bone marrow signal. Sinuses/Orbits: Unremarkable orbits. Paranasal sinuses and mastoid air cells are  clear. Other: None. IMPRESSION: 1. No evidence of intracranial metastases. 2. Mild chronic small vessel ischemic disease. Electronically Signed   By: Logan Bores M.D.   On: 06/08/2020 12:31   NM PET Image Initial (PI) Skull Base To Thigh  Result Date: 06/18/2020 CLINICAL DATA:  Initial treatment strategy for Lung cancer. EXAM: NUCLEAR MEDICINE PET SKULL BASE TO THIGH TECHNIQUE: 6.8 mCi F-18 FDG was injected intravenously. Full-ring PET imaging was performed from the skull base to thigh after the radiotracer. CT data was obtained and used for attenuation correction and anatomic localization. Fasting blood glucose: 116 mg/dl COMPARISON:  None. FINDINGS: Mediastinal blood pool activity: SUV max 2.4 Liver activity: SUV max NA NECK: No hypermetabolic lymph nodes in the neck. Incidental CT findings: none CHEST: Large centrally necrotic mass involving the right lower lobe measures 11.8 cm in maximum dimension with SUV max of 21.55, image 45/8. The mass involves the right hilum with postobstructive changes including complete atelectasis of the right middle lobe. Further, the mass appears intimately associated with the right hemidiaphragm. Cannot exclude involvement of the right hemidiaphragm. FDG avid subcarinal lymph node measures 1.9 cm and has an SUV max of 15.17, image 65/4. Low right paratracheal lymph node has a short axis of 8 mm with SUV max of 2.88. Small left pre-vascular lymph nodes exhibit mild FDG uptake just above background blood pool activity. Index lymph node measures 5 mm and has an SUV max of 2.54, image 57/4. Within the posteromedial right upper lobe there is a FDG avid nodule measuring 1 cm with SUV max of 5.79, image 24/8. Also in the right upper lobe is a 1 cm peribronchovascular nodule with SUV max of 1.0, image 24/8. No FDG avid nodules within the left hemithorax. Incidental CT findings: Aortic atherosclerosis. Coronary artery calcifications. Trace right pleural fluid posteriorly, image 59/4.  ABDOMEN/PELVIS: No abnormal hypermetabolic activity within the liver, pancreas, adrenal glands, or spleen. No hypermetabolic lymph nodes in the abdomen or pelvis. Incidental CT findings: Aortic atherosclerosis.  No aneurysm. SKELETON: No focal hypermetabolic activity to suggest skeletal metastasis. Incidental CT findings: none IMPRESSION: 1. Intense FDG uptake is associated with the large, centrally necrotic right lower lobe lung mass. The mass encases the right hilum and there is postobstructive atelectasis of the right middle lobe. Enlarged FDG avid subcarinal lymph node is identified consistent with metastatic adenopathy. There is equivocal uptake within subcentimeter right paratracheal and left prevascular lymph nodes. Separate FDG avid nodule within the posteromedial right upper lobe is identified. Assuming non-small cell histology imaging findings are compatible with a T4N2M0 lesion or stage IIIB disease. 2. Cannot exclude involvement of the right hemidiaphragm. If clinically indicated consider contrast enhanced CT or MRI of the chest to assess involvement of the right hemidiaphragm. Electronically Signed  By: Kerby Moors M.D.   On: 06/18/2020 09:13    ASSESSMENT AND PLAN: This is a very pleasant 65 years old white female recently diagnosed with a stage IIIb (T4, N2, M0) non-small cell lung cancer, squamous cell carcinoma presented with large centrally necrotic right upper lobe lung mass encasing the right hilum and close postobstructive atelectasis in the right middle lobe as well as enlargement subcarinal lymphadenopathy and addition to right paratracheal and left prevascular lymph nodes diagnosed in April 2022.  The patient is currently undergoing neoadjuvant systemic chemotherapy with carboplatin for AUC of 5, paclitaxel 175 Mg/M2 and nivolumab 360 mg IV every 3 weeks with Neulasta support status post 1 cycle started last week. She tolerated the first week of her treatment well except for the  fatigue and aching pain after the Neulasta injection. I recommended for the patient to continue her treatment as planned and she will come back for follow-up visit in 2 weeks for evaluation before starting cycle #2. For the nausea, I will give her prescription for Zofran. For the expected alopecia, I will give her prescription for cranial prosthesis. The patient was advised to call immediately if she has any concerning symptoms in the interval. The patient voices understanding of current disease status and treatment options and is in agreement with the current care plan.  All questions were answered. The patient knows to call the clinic with any problems, questions or concerns. We can certainly see the patient much sooner if necessary. The total time spent in the appointment was 20 minutes.  Disclaimer: This note was dictated with voice recognition software. Similar sounding words can inadvertently be transcribed and may not be corrected upon review.

## 2020-07-09 ENCOUNTER — Other Ambulatory Visit: Payer: Medicare Other

## 2020-07-09 ENCOUNTER — Other Ambulatory Visit: Payer: Self-pay

## 2020-07-09 ENCOUNTER — Inpatient Hospital Stay: Payer: Medicare Other

## 2020-07-09 DIAGNOSIS — R630 Anorexia: Secondary | ICD-10-CM

## 2020-07-09 DIAGNOSIS — C3431 Malignant neoplasm of lower lobe, right bronchus or lung: Secondary | ICD-10-CM

## 2020-07-09 DIAGNOSIS — Z23 Encounter for immunization: Secondary | ICD-10-CM

## 2020-07-09 DIAGNOSIS — R634 Abnormal weight loss: Secondary | ICD-10-CM

## 2020-07-09 DIAGNOSIS — Z5112 Encounter for antineoplastic immunotherapy: Secondary | ICD-10-CM | POA: Diagnosis not present

## 2020-07-09 LAB — CMP (CANCER CENTER ONLY)
ALT: 19 U/L (ref 0–44)
AST: 20 U/L (ref 15–41)
Albumin: 3.1 g/dL — ABNORMAL LOW (ref 3.5–5.0)
Alkaline Phosphatase: 98 U/L (ref 38–126)
Anion gap: 11 (ref 5–15)
BUN: 16 mg/dL (ref 8–23)
CO2: 27 mmol/L (ref 22–32)
Calcium: 9.9 mg/dL (ref 8.9–10.3)
Chloride: 102 mmol/L (ref 98–111)
Creatinine: 0.65 mg/dL (ref 0.44–1.00)
GFR, Estimated: >60 mL/min (ref >60)
Glucose, Bld: 85 mg/dL (ref 70–99)
Potassium: 4.7 mmol/L (ref 3.5–5.1)
Sodium: 140 mmol/L (ref 135–145)
Total Bilirubin: 0.2 mg/dL — ABNORMAL LOW (ref 0.3–1.2)
Total Protein: 6.9 g/dL (ref 6.5–8.1)

## 2020-07-09 LAB — CBC WITH DIFFERENTIAL (CANCER CENTER ONLY)
Abs Immature Granulocytes: 0.12 10*3/uL — ABNORMAL HIGH (ref 0.00–0.07)
Basophils Absolute: 0.1 10*3/uL (ref 0.0–0.1)
Basophils Relative: 1 %
Eosinophils Absolute: 0.1 10*3/uL (ref 0.0–0.5)
Eosinophils Relative: 1 %
HCT: 36.3 % (ref 36.0–46.0)
Hemoglobin: 11.4 g/dL — ABNORMAL LOW (ref 12.0–15.0)
Immature Granulocytes: 1 %
Lymphocytes Relative: 17 %
Lymphs Abs: 1.8 10*3/uL (ref 0.7–4.0)
MCH: 29.2 pg (ref 26.0–34.0)
MCHC: 31.4 g/dL (ref 30.0–36.0)
MCV: 92.8 fL (ref 80.0–100.0)
Monocytes Absolute: 0.9 10*3/uL (ref 0.1–1.0)
Monocytes Relative: 9 %
Neutro Abs: 7.6 10*3/uL (ref 1.7–7.7)
Neutrophils Relative %: 71 %
Platelet Count: 310 10*3/uL (ref 150–400)
RBC: 3.91 MIL/uL (ref 3.87–5.11)
RDW: 13.5 % (ref 11.5–15.5)
WBC Count: 10.6 10*3/uL — ABNORMAL HIGH (ref 4.0–10.5)
nRBC: 0 % (ref 0.0–0.2)

## 2020-07-10 ENCOUNTER — Encounter: Payer: Self-pay | Admitting: Emergency Medicine

## 2020-07-10 ENCOUNTER — Ambulatory Visit: Payer: Medicare Other | Admitting: Emergency Medicine

## 2020-07-10 DIAGNOSIS — J301 Allergic rhinitis due to pollen: Secondary | ICD-10-CM

## 2020-07-10 DIAGNOSIS — J309 Allergic rhinitis, unspecified: Secondary | ICD-10-CM | POA: Insufficient documentation

## 2020-07-10 DIAGNOSIS — C3491 Malignant neoplasm of unspecified part of right bronchus or lung: Secondary | ICD-10-CM | POA: Diagnosis not present

## 2020-07-10 DIAGNOSIS — J449 Chronic obstructive pulmonary disease, unspecified: Secondary | ICD-10-CM

## 2020-07-10 NOTE — Progress Notes (Signed)
Subjective:    Patient ID: [name], female    DOB: [birth year], 65 y.o.   MRN: [id number]  HPI [name] is 9, former smoker (20 pack years) with a history of osteoarthritis, COPD/asthma, GERD.  She is referred today for an abnormal CT scan of the chest.   She reports she started to have sore throat and cough end of 2021. Has experienced progressive exertional SOB over last few months.  She was treated for allergies. The cough persisted. She has been managed on breztri since January, albuterol which she uses before exercise - it does help her, mucinex. Was referred for allergy / asthma eval.  A CXR was done by Dr Donneta Romberg 3/29, prompted CT chest as below.  CT chest with IV contrast 05/27/2020 performed at [facility].  I have the written report available without the images.  This showed a 8.2 x 10.3 x 6.1 cm right lower lobe mass that encased the lower lobe bronchus which is almost completely obstructed.  There is encasement of the right lower lobe pulmonary artery as well with associated right hilar and subcarinal lymphadenopathy.  Note was also made of a separate 1 cm superior segmental right lower lobe nodule and a 6 mm low-attenuation lesion in the right lobe of the liver that was indeterminate.  ROV 07/10/20 --[name] is 59, has a history of former tobacco use, COPD, GERD.  She underwent bronchoscopy 06/11/2020 to evaluate right lower lobe mass.  Biopsies consistent with squamous cell lung cancer.  She is following up with Dr. Julien Nordmann with oncology, staged as IIIB.  Undergoing neoadjuvant systemic chemotherapy She reports that chemo has been proceeding, has been difficult at time, but her side effects are now improved. Hopefully there will be an option for surgery.   She walks daily. Good functional capacity but she does feel some SOB with hills. 2-3 miles a day. She coughs daily - getting better, dry cough. No PND or congestion. She uses saline nasal spray, flonase occasionally. She is on breztri  reliably. Uses albuterol before walking - does help her.    Review of Systems As per HPI      Objective:   Physical Exam Vitals:   07/10/20 1036  BP: 104/70  Pulse: 95  Temp: 97.6 F (36.4 C)  TempSrc: Temporal  SpO2: 100%  Weight: 133 lb 3.2 oz (60.4 kg)  Height: 5\' 3"  (1.6 m)   Gen: Pleasant, well-nourished, in no distress,  normal affect  ENT: No lesions,  mouth clear,  oropharynx clear, no postnasal drip  Neck: No JVD, no stridor  Lungs: No use of accessory muscles, somewhat decreased on the right, no crackles or wheezing on normal respiration, no wheeze on forced expiration  Cardiovascular: RRR, heart sounds normal, no murmur or gallops, no peripheral edema  Musculoskeletal: No deformities, no cyanosis or clubbing  Neuro: alert, awake, non focal  Skin: Warm, no lesions or rash      Assessment & Plan:  Stage III squamous cell carcinoma of right lung (HCC) Tolerating chemotherapy and immunotherapy under the direction of Dr. Julien Nordmann.  She will also have neoadjuvant radiation.  Hopefully after treatment and imaging she may be a candidate for surgical resection.  It sounds like she has a consultation with thoracic surgery at [facility] to discuss this as a potential option.  She will need pulmonary function testing.  I am going to defer these until she has undergone her neoadjuvant regimen so that we can assess her true preoperative pulmonary capacity.  COPD (chronic obstructive pulmonary disease) (HCC) Continue Breztri, albuterol as needed and pretreating exertion  Allergic rhinitis Using saline nasal spray and fluticasone nasal spray as needed.  Baltazar Apo, MD, PhD 07/10/2020, 11:01 AM [facility] [phone] or if no answer before 7:00PM call [phone] For any issues after 7:00PM please call eLink [phone]

## 2020-07-10 NOTE — Assessment & Plan Note (Signed)
Using saline nasal spray and fluticasone nasal spray as needed.

## 2020-07-10 NOTE — Addendum Note (Signed)
Addended by: Gavin Potters R on: 07/10/2020 11:56 AM   Modules accepted: Orders

## 2020-07-10 NOTE — Patient Instructions (Addendum)
Please continue Bevespi 2 puffs twice a day.  Rinse and gargle after using. Keep your albuterol available to use 2 puffs when needed for shortness of breath or 2 puffs prior to exertion to help your breathing. Continue to follow with Dr. Julien Nordmann to coordinate your chemotherapy, immunotherapy, radiation therapy.  Hopefully there will be an option to discuss possible surgical resection at some point going forward depending on your response. We will plan to perform pulmonary function testing after your current treatment regimen is completed.  This will help in preparation for any possible planned surgery. Continue your fluticasone nasal spray 2 sprays each nostril daily if needed for increased allergy symptoms Continue your nasal saline spray Follow with Dr Lamonte Sakai in 3 months or sooner if you have any problems.

## 2020-07-10 NOTE — Assessment & Plan Note (Signed)
Continue Breztri, albuterol as needed and pretreating exertion

## 2020-07-10 NOTE — Assessment & Plan Note (Signed)
Tolerating chemotherapy and immunotherapy under the direction of Dr. Julien Nordmann.  She will also have neoadjuvant radiation.  Hopefully after treatment and imaging she may be a candidate for surgical resection.  It sounds like she has a consultation with thoracic surgery at [facility] to discuss this as a potential option.  She will need pulmonary function testing.  I am going to defer these until she has undergone her neoadjuvant regimen so that we can assess her true preoperative pulmonary capacity.

## 2020-07-14 NOTE — Progress Notes (Signed)
[place] [facility] OFFICE PROGRESS NOTE  Emma Rasmussen, MD [place] [id number]  DIAGNOSIS: StageIIIB (T4, N2, M0)Non-Small Cell Lung Cancer, Squamous cell carcinoma.She presented with a large centrally necrotic right upper lobe lung mass that encases the right hilum with postobstructive atelectasis in the right middle lobe. There is also enlarged subcarinal lymphadenopathy and equivocal uptake in the right paratracheal and left prevascular lymph nodes. There was also a separate posterior medial right upper lobe nodule.Unable to exclude involvement in the right hemidiaphragm. She was diagnosed in April 2022.  PRIOR THERAPY: None  CURRENT THERAPY: Neoadjuvant systemic chemotherapy with carboplatin for AUC of 5, paclitaxel 175 Mg/M2 and nivolumab 360 mg IV every 3 weeks.  First dose June 25, 2020.  Status post 1 cycle.   INTERVAL HISTORY: [name] 65 y.o. female returns to the clinic today for a follow-up visit.  The patient is feeling well today without any concerning complaints. She actually stated this is the best she has felt in months. The patient is status post her first cycle of chemotherapy and immunotherapy and tolerated fairly well except for pain after Neulasta with fatigue, myalgias, and arthralgias which improved a few days after treatment.  The interval since her last appointment she had a follow-up visit with pulmonologist, Dr. Lamonte Sakai.  She also has an appointment with [facility] on 07/24/20 with a cardiothoracic surgeon, Loma Sender.  Otherwise the patient denies any recent fever, chills, night sweats, or weight loss. She had one episode of night sweats the night of her last infusion but that was the only occurrence.  She denies any chest pain.  She reports some shortness of breath with walking uphill. She walks regularly. Denies any chest pain.  She previously had some blood-tinged sputum which is resolved at this time.  She  denies any headache or visual changes.  She denies any rashes or skin changes.  She denies any vomiting, recent nausea, constipation or diarrhea. The patient is here today for evaluation and repeat blood work before starting cycle #2.  MEDICAL HISTORY: Past Medical History:  Diagnosis Date  . Anxiety   . Asthma   . Colon polyps   . COPD (chronic obstructive pulmonary disease) ([place])    patient denies this dx  . Dyspnea    with exertion  . Osteoarthritis   . Pneumonia   . Right lower lobe lung mass     ALLERGIES:  is allergic to morphine and related and percocet [oxycodone-acetaminophen].  MEDICATIONS:  Current Outpatient Medications  Medication Sig Dispense Refill  . albuterol (VENTOLIN HFA) 108 (90 Base) MCG/ACT inhaler Inhale 1-2 puffs into the lungs every 4 (four) hours as needed for wheezing or shortness of breath.    . ASHWAGANDHA PO Take 1 capsule by mouth daily.    Marland Kitchen BREZTRI AEROSPHERE 160-9-4.8 MCG/ACT AERO Take 1 puff by mouth 2 (two) times daily.    . Budeson-Glycopyrrol-Formoterol (BREZTRI AEROSPHERE) 160-9-4.8 MCG/ACT AERO 2 puffs    . clonazePAM (KLONOPIN) 0.5 MG tablet Take 0.25-0.5 mg by mouth at bedtime as needed.    . fluticasone (FLONASE) 50 MCG/ACT nasal spray Place 1 spray into both nostrils daily as needed for allergies.    . folic acid (FOLVITE) 194 MCG tablet Take 400 mcg by mouth daily.    . Glucosamine-Chondroitin (OSTEO BI-FLEX REGULAR STRENGTH PO) Take 2 tablets by mouth daily.    Marland Kitchen ibuprofen (ADVIL) 200 MG tablet Take 400 mg by mouth every 6 (six)  hours as needed for headache or moderate pain.    . mirtazapine (REMERON) 15 MG tablet Take 1 tablet (15 mg total) by mouth at bedtime. 30 tablet 2  . Multiple Minerals-Vitamins (MULTISOURCE CALCIUM MAG/D) TABS Take 1 tablet by mouth daily.    . Omega-3 Fatty Acids (FISH OIL) 1000 MG CAPS Take 1,000 mg by mouth daily.    . ondansetron (ZOFRAN) 8 MG tablet Take 1 tablet (8 mg total) by mouth every 8 (eight) hours  as needed for nausea or vomiting. 20 tablet 0  . prochlorperazine (COMPAZINE) 10 MG tablet Take 1 tablet (10 mg total) by mouth every 6 (six) hours as needed. 30 tablet 2  . sucralfate (CARAFATE) 1 g tablet Take 1 g by mouth 2 (two) times daily.    . TURMERIC PO Take 1 tablet by mouth daily.     No current facility-administered medications for this visit.    SURGICAL HISTORY:  Past Surgical History:  Procedure Laterality Date  . ABDOMINAL HYSTERECTOMY N/A 04/28/2015   Procedure: TOTAL HYSTERECTOMY ABDOMINAL;  Surgeon: Nunzio Cobbs, MD;  Location: [facility];  Service: Gynecology;  Laterality: N/A;  . BRONCHIAL BIOPSY  06/11/2020   Procedure: BRONCHIAL BIOPSIES;  Surgeon: Collene Gobble, MD;  Location: [facility] ENDOSCOPY;  Service: Cardiopulmonary;;  . BRONCHIAL BRUSHINGS  06/11/2020   Procedure: BRONCHIAL BRUSHINGS;  Surgeon: Collene Gobble, MD;  Location: [facility];  Service: Cardiopulmonary;;  . BRONCHIAL NEEDLE ASPIRATION BIOPSY  06/11/2020   Procedure: BRONCHIAL NEEDLE ASPIRATION BIOPSIES;  Surgeon: Collene Gobble, MD;  Location: [facility];  Service: Cardiopulmonary;;  . BRONCHIAL WASHINGS  06/11/2020   Procedure: BRONCHIAL WASHINGS;  Surgeon: Collene Gobble, MD;  Location: [facility] ENDOSCOPY;  Service: Cardiopulmonary;;  . BUNIONECTOMY Bilateral   . COLONOSCOPY    . rectal nodule removal  05/2012   --benign with Dr. Elmo Putt Thomas([facility])  . SALPINGOOPHORECTOMY Bilateral 04/28/2015   Procedure: BILATERAL SALPINGO OOPHORECTOMY WITH PELVIC WASHINGS ;  Surgeon: Nunzio Cobbs, MD;  Location: [facility];  Service: Gynecology;  Laterality: Bilateral;  . VIDEO BRONCHOSCOPY WITH ENDOBRONCHIAL ULTRASOUND N/A 06/11/2020   Procedure: VIDEO BRONCHOSCOPY WITH ENDOBRONCHIAL ULTRASOUND;  Surgeon: Collene Gobble, MD;  Location: [facility];  Service: Cardiopulmonary;  Laterality: N/A;  . WISDOM TOOTH EXTRACTION      REVIEW OF SYSTEMS:   Review of Systems  Constitutional: Negative for  appetite change, chills, fatigue, fever and unexpected weight change.  HENT: Negative for mouth sores, nosebleeds, sore throat and trouble swallowing.   Eyes: Negative for eye problems and icterus.  Respiratory: Positive for dyspnea on exertion. Negative for cough, hemoptysis, and wheezing.   Cardiovascular: Negative for chest pain and leg swelling.  Gastrointestinal: Negative for abdominal pain, constipation, diarrhea, nausea and vomiting.  Genitourinary: Negative for bladder incontinence, difficulty urinating, dysuria, frequency and hematuria.   Musculoskeletal: Negative for back pain, gait problem, neck pain and neck stiffness.  Skin: Negative for itching and rash.  Neurological: Negative for dizziness, extremity weakness, gait problem, headaches, light-headedness and seizures.  Hematological: Negative for adenopathy. Does not bruise/bleed easily.  Psychiatric/Behavioral: Negative for confusion, depression and sleep disturbance. The patient is not nervous/anxious.     PHYSICAL EXAMINATION:  Blood pressure 108/73, pulse 91, temperature 97.9 F (36.6 C), temperature source Tympanic, resp. rate 18, height 5\' 3"  (1.6 m), weight 131 lb 8 oz (59.6 kg), last menstrual period 02/28/2009, SpO2 99 %.  ECOG PERFORMANCE STATUS: 1 - Symptomatic but completely ambulatory  Physical Exam  Constitutional: Oriented to person, place, and time and well-developed, well-nourished, and in no distress.  HENT:  Head: Normocephalic and atraumatic.  Mouth/Throat: Oropharynx is clear and moist. No oropharyngeal exudate.  Eyes: Conjunctivae are normal. Right eye exhibits no discharge. Left eye exhibits no discharge. No scleral icterus.  Neck: Normal range of motion. Neck supple.  Cardiovascular: Normal rate, regular rhythm, normal heart sounds and intact distal pulses.  Pulmonary/Chest: Effort normal. Decreased breath sounds in RLL.No respiratory distress. No wheezes. No rales.  Abdominal: Soft. Bowel sounds  are normal. Exhibits no distension and no mass. There is no tenderness.  Musculoskeletal: Normal range of motion. Exhibits no edema.  Lymphadenopathy:  No cervical adenopathy.  Neurological: Alert and oriented to person, place, and time. Exhibits normal muscle tone. Gait normal. Coordination normal.  Skin: Skin is warm and dry. No rash noted. Not diaphoretic. No erythema. No pallor.  Psychiatric: Mood, memory and judgment normal.  Vitals reviewed.  LABORATORY DATA: Lab Results  Component Value Date   WBC 8.9 07/16/2020   HGB 11.9 (L) 07/16/2020   HCT 37.1 07/16/2020   MCV 92.5 07/16/2020   PLT 196 07/16/2020      Chemistry      Component Value Date/Time   NA 141 07/16/2020 1020   K 4.3 07/16/2020 1020   CL 103 07/16/2020 1020   CO2 27 07/16/2020 1020   BUN 19 07/16/2020 1020   CREATININE 0.63 07/16/2020 1020      Component Value Date/Time   CALCIUM 9.6 07/16/2020 1020   ALKPHOS 95 07/16/2020 1020   AST 22 07/16/2020 1020   ALT 18 07/16/2020 1020   BILITOT 0.3 07/16/2020 1020       RADIOGRAPHIC STUDIES:  NM PET Image Initial (PI) Skull Base To Thigh  Result Date: 06/18/2020 CLINICAL DATA:  Initial treatment strategy for Lung cancer. EXAM: NUCLEAR MEDICINE PET SKULL BASE TO THIGH TECHNIQUE: 6.8 mCi F-18 FDG was injected intravenously. Full-ring PET imaging was performed from the skull base to thigh after the radiotracer. CT data was obtained and used for attenuation correction and anatomic localization. Fasting blood glucose: 116 mg/dl COMPARISON:  None. FINDINGS: Mediastinal blood pool activity: SUV max 2.4 Liver activity: SUV max NA NECK: No hypermetabolic lymph nodes in the neck. Incidental CT findings: none CHEST: Large centrally necrotic mass involving the right lower lobe measures 11.8 cm in maximum dimension with SUV max of 21.55, image 45/8. The mass involves the right hilum with postobstructive changes including complete atelectasis of the right middle lobe.  Further, the mass appears intimately associated with the right hemidiaphragm. Cannot exclude involvement of the right hemidiaphragm. FDG avid subcarinal lymph node measures 1.9 cm and has an SUV max of 15.17, image 65/4. Low right paratracheal lymph node has a short axis of 8 mm with SUV max of 2.88. Small left pre-vascular lymph nodes exhibit mild FDG uptake just above background blood pool activity. Index lymph node measures 5 mm and has an SUV max of 2.54, image 57/4. Within the posteromedial right upper lobe there is a FDG avid nodule measuring 1 cm with SUV max of 5.79, image 24/8. Also in the right upper lobe is a 1 cm peribronchovascular nodule with SUV max of 1.0, image 24/8. No FDG avid nodules within the left hemithorax. Incidental CT findings: Aortic atherosclerosis. Coronary artery calcifications. Trace right pleural fluid posteriorly, image 59/4. ABDOMEN/PELVIS: No abnormal hypermetabolic activity within the liver, pancreas, adrenal glands, or spleen. No hypermetabolic lymph nodes in the abdomen or pelvis. Incidental  CT findings: Aortic atherosclerosis.  No aneurysm. SKELETON: No focal hypermetabolic activity to suggest skeletal metastasis. Incidental CT findings: none IMPRESSION: 1. Intense FDG uptake is associated with the large, centrally necrotic right lower lobe lung mass. The mass encases the right hilum and there is postobstructive atelectasis of the right middle lobe. Enlarged FDG avid subcarinal lymph node is identified consistent with metastatic adenopathy. There is equivocal uptake within subcentimeter right paratracheal and left prevascular lymph nodes. Separate FDG avid nodule within the posteromedial right upper lobe is identified. Assuming non-small cell histology imaging findings are compatible with a T4N2M0 lesion or stage IIIB disease. 2. Cannot exclude involvement of the right hemidiaphragm. If clinically indicated consider contrast enhanced CT or MRI of the chest to assess  involvement of the right hemidiaphragm. Electronically Signed   By: Kerby Moors M.D.   On: 06/18/2020 09:13     ASSESSMENT/PLAN:  This is a very pleasant 65 year old Caucasian female diagnosed with stage IIIB (T4, N2, M0) Non-Small Cell Lung Cancer, Squamous cell carcinoma.  She presented with a large centrally necrotic right upper lobe lung mass that encases the right hilum with postobstructive atelectasis in the right middle lobe.  There is also enlarged subcarinal lymphadenopathy and equivocal uptake in the right paratracheal and left prevascular lymph nodes.  There was also a separate posterior medial right upper lobe nodule. Unable to exclude involvement in the right hemidiaphragm. She was diagnosed in April 2022.  The patient is likely not a surgical candidate at this time due to the size of the mass. Standard of care for stage IIIb disease would be concurrent chemoradiation; however, given the size of the tumor, this would be a very large radiation field.  Therefore, it was recommended that the patient undergo 3 cycles of systemic chemotherapy/immunotherapy with carboplatin for an AUC of 5, paclitaxel 175 mg per metered squared, nivolumab 360 mg IV every 3 weeks. After she completes 3 cycles of systemic chemotherapy, if a positive response to treatment we may proceed with concurrent chemoradiation or surgery, if the surgeon at [facility] thinks that she is a surgical candidate if we are able to downstage her disease.   The patient is status post 1 cycle of treatment and she tolerated it fairly well.  The patient was seen with Dr. Julien Nordmann today.  Labs are reviewed.  Recommend that she proceed with cycle #2 she is scheduled.  We will see her back for follow-up visit in 3 weeks for evaluation before starting cycle #3.  She will follow-up with her cardiothoracic surgeon at [facility] on 07/24/20 as scheduled.  Reviewed management of arthralgias and myalgias with neulasta. Discussed using  claritin and tylenol if needed.   The patient was advised to call immediately if she has any concerning symptoms in the interval. The patient voices understanding of current disease status and treatment options and is in agreement with the current care plan. All questions were answered. The patient knows to call the clinic with any problems, questions or concerns. We can certainly see the patient much sooner if necessary      No orders of the defined types were placed in this encounter.   Emma Hargett L Letica Giaimo, PA-C 07/16/20  ADDENDUM: Hematology/Oncology Attending: I had a face-to-face encounter with the patient.  I reviewed her records and recommended her care plan.  This is a very pleasant 65 years old white female with a stage IIIb (T4, N2, M0) non-small cell lung cancer, squamous cell carcinoma diagnosed in April 2022.  The patient was not a surgical candidate at the time of presentation.  She is currently undergoing a course of neoadjuvant chemotherapy with carboplatin, Alimta and nivolumab every 3 weeks status post 1 cycle.  She tolerated the first cycle of her treatment fairly well. I recommended for the patient to proceed with cycle #2 today as planned. She will come back for follow-up visit in 3 weeks for evaluation before cycle #3.  The patient will have repeat imaging studies after cycle #3 and if she has good response to this treatment, she may be considered for surgical resection but if no improvement she would be treated with a course of concurrent chemoradiation. The patient was advised to call immediately if she has any other concerning symptoms in the interval.  Disclaimer: This note was dictated with voice recognition software. Similar sounding words can inadvertently be transcribed and may be missed upon review. Eilleen Kempf, MD 07/17/20

## 2020-07-16 ENCOUNTER — Other Ambulatory Visit: Payer: Self-pay

## 2020-07-16 ENCOUNTER — Inpatient Hospital Stay: Payer: Medicare Other

## 2020-07-16 ENCOUNTER — Inpatient Hospital Stay: Payer: Medicare Other | Admitting: Physician Assistant

## 2020-07-16 ENCOUNTER — Other Ambulatory Visit: Payer: Medicare Other

## 2020-07-16 ENCOUNTER — Inpatient Hospital Stay: Payer: Medicare Other | Admitting: Nutrition

## 2020-07-16 VITALS — BP 108/73 | HR 91 | Temp 97.9°F | Resp 18 | Ht 63.0 in | Wt 131.5 lb

## 2020-07-16 DIAGNOSIS — Z5111 Encounter for antineoplastic chemotherapy: Secondary | ICD-10-CM

## 2020-07-16 DIAGNOSIS — C3491 Malignant neoplasm of unspecified part of right bronchus or lung: Secondary | ICD-10-CM

## 2020-07-16 DIAGNOSIS — Z5112 Encounter for antineoplastic immunotherapy: Secondary | ICD-10-CM

## 2020-07-16 DIAGNOSIS — R634 Abnormal weight loss: Secondary | ICD-10-CM

## 2020-07-16 DIAGNOSIS — R630 Anorexia: Secondary | ICD-10-CM

## 2020-07-16 DIAGNOSIS — C3431 Malignant neoplasm of lower lobe, right bronchus or lung: Secondary | ICD-10-CM

## 2020-07-16 LAB — CBC WITH DIFFERENTIAL (CANCER CENTER ONLY)
Abs Immature Granulocytes: 0.04 10*3/uL (ref 0.00–0.07)
Basophils Absolute: 0.1 10*3/uL (ref 0.0–0.1)
Basophils Relative: 1 %
Eosinophils Absolute: 0.1 10*3/uL (ref 0.0–0.5)
Eosinophils Relative: 2 %
HCT: 37.1 % (ref 36.0–46.0)
Hemoglobin: 11.9 g/dL — ABNORMAL LOW (ref 12.0–15.0)
Immature Granulocytes: 0 %
Lymphocytes Relative: 23 %
Lymphs Abs: 2.1 10*3/uL (ref 0.7–4.0)
MCH: 29.7 pg (ref 26.0–34.0)
MCHC: 32.1 g/dL (ref 30.0–36.0)
MCV: 92.5 fL (ref 80.0–100.0)
Monocytes Absolute: 1 10*3/uL (ref 0.1–1.0)
Monocytes Relative: 11 %
Neutro Abs: 5.6 10*3/uL (ref 1.7–7.7)
Neutrophils Relative %: 63 %
Platelet Count: 196 10*3/uL (ref 150–400)
RBC: 4.01 MIL/uL (ref 3.87–5.11)
RDW: 14.1 % (ref 11.5–15.5)
WBC Count: 8.9 10*3/uL (ref 4.0–10.5)
nRBC: 0 % (ref 0.0–0.2)

## 2020-07-16 LAB — CMP (CANCER CENTER ONLY)
ALT: 18 U/L (ref 0–44)
AST: 22 U/L (ref 15–41)
Albumin: 3.5 g/dL (ref 3.5–5.0)
Alkaline Phosphatase: 95 U/L (ref 38–126)
Anion gap: 11 (ref 5–15)
BUN: 19 mg/dL (ref 8–23)
CO2: 27 mmol/L (ref 22–32)
Calcium: 9.6 mg/dL (ref 8.9–10.3)
Chloride: 103 mmol/L (ref 98–111)
Creatinine: 0.63 mg/dL (ref 0.44–1.00)
GFR, Estimated: >60 mL/min (ref >60)
Glucose, Bld: 88 mg/dL (ref 70–99)
Potassium: 4.3 mmol/L (ref 3.5–5.1)
Sodium: 141 mmol/L (ref 135–145)
Total Bilirubin: 0.3 mg/dL (ref 0.3–1.2)
Total Protein: 6.8 g/dL (ref 6.5–8.1)

## 2020-07-16 LAB — TSH: TSH: 1.351 u[IU]/mL (ref 0.308–3.960)

## 2020-07-16 MED ORDER — DIPHENHYDRAMINE HCL 50 MG/ML IJ SOLN
INTRAMUSCULAR | Status: AC
  Filled 2020-07-16: qty 1

## 2020-07-16 MED ORDER — FAMOTIDINE 20 MG IN NS 100 ML IVPB
20.0000 mg | Freq: Once | INTRAVENOUS | Status: AC
  Administered 2020-07-16: 20 mg via INTRAVENOUS

## 2020-07-16 MED ORDER — HEPARIN SOD (PORK) LOCK FLUSH 100 UNIT/ML IV SOLN
500.0000 [IU] | Freq: Once | INTRAVENOUS | Status: DC | PRN
  Filled 2020-07-16: qty 5

## 2020-07-16 MED ORDER — FAMOTIDINE 20 MG IN NS 100 ML IVPB
INTRAVENOUS | Status: AC
  Filled 2020-07-16: qty 100

## 2020-07-16 MED ORDER — SODIUM CHLORIDE 0.9 % IV SOLN
Freq: Once | INTRAVENOUS | Status: AC
  Filled 2020-07-16: qty 250

## 2020-07-16 MED ORDER — PALONOSETRON HCL INJECTION 0.25 MG/5ML
INTRAVENOUS | Status: AC
  Filled 2020-07-16: qty 5

## 2020-07-16 MED ORDER — SODIUM CHLORIDE 0.9 % IV SOLN
360.0000 mg | Freq: Once | INTRAVENOUS | Status: AC
  Administered 2020-07-16: 360 mg via INTRAVENOUS
  Filled 2020-07-16: qty 24

## 2020-07-16 MED ORDER — SODIUM CHLORIDE 0.9 % IV SOLN
150.0000 mg | Freq: Once | INTRAVENOUS | Status: AC
  Administered 2020-07-16: 150 mg via INTRAVENOUS
  Filled 2020-07-16: qty 150

## 2020-07-16 MED ORDER — SODIUM CHLORIDE 0.9 % IV SOLN
175.0000 mg/m2 | Freq: Once | INTRAVENOUS | Status: AC
  Administered 2020-07-16: 288 mg via INTRAVENOUS
  Filled 2020-07-16: qty 48

## 2020-07-16 MED ORDER — PALONOSETRON HCL INJECTION 0.25 MG/5ML
0.2500 mg | Freq: Once | INTRAVENOUS | Status: AC
  Administered 2020-07-16: 0.25 mg via INTRAVENOUS

## 2020-07-16 MED ORDER — SODIUM CHLORIDE 0.9 % IV SOLN
10.0000 mg | Freq: Once | INTRAVENOUS | Status: AC
  Administered 2020-07-16: 10 mg via INTRAVENOUS
  Filled 2020-07-16: qty 10

## 2020-07-16 MED ORDER — SODIUM CHLORIDE 0.9% FLUSH
10.0000 mL | INTRAVENOUS | Status: DC | PRN
  Filled 2020-07-16: qty 10

## 2020-07-16 MED ORDER — DIPHENHYDRAMINE HCL 50 MG/ML IJ SOLN
50.0000 mg | Freq: Once | INTRAMUSCULAR | Status: AC
  Administered 2020-07-16: 50 mg via INTRAVENOUS

## 2020-07-16 MED ORDER — SODIUM CHLORIDE 0.9 % IV SOLN
397.0000 mg | Freq: Once | INTRAVENOUS | Status: AC
  Administered 2020-07-16: 400 mg via INTRAVENOUS
  Filled 2020-07-16: qty 40

## 2020-07-16 NOTE — Patient Instructions (Signed)
[facility]  Discharge Instructions: Thank you for choosing [facility] to provide your oncology and hematology care.   If you have a lab appointment with the [facility], please go directly to the [facility] and check in at the registration area.   Wear comfortable clothing and clothing appropriate for easy access to any Portacath or PICC line.   We strive to give you quality time with your provider. You may need to reschedule your appointment if you arrive late (15 or more minutes).  Arriving late affects you and other patients whose appointments are after yours.  Also, if you miss three or more appointments without notifying the office, you may be dismissed from the clinic at the provider's discretion.      For prescription refill requests, have your pharmacy contact our office and allow 72 hours for refills to be completed.    Today you received the following chemotherapy and/or immunotherapy agents: Opdivo, Paclitaxel, Carboplatin      To help prevent nausea and vomiting after your treatment, we encourage you to take your nausea medication as directed.  BELOW ARE SYMPTOMS THAT SHOULD BE REPORTED IMMEDIATELY: . *FEVER GREATER THAN 100.4 F (38 C) OR HIGHER . *CHILLS OR SWEATING . *NAUSEA AND VOMITING THAT IS NOT CONTROLLED WITH YOUR NAUSEA MEDICATION . *UNUSUAL SHORTNESS OF BREATH . *UNUSUAL BRUISING OR BLEEDING . *URINARY PROBLEMS (pain or burning when urinating, or frequent urination) . *BOWEL PROBLEMS (unusual diarrhea, constipation, pain near the anus) . TENDERNESS IN MOUTH AND THROAT WITH OR WITHOUT PRESENCE OF ULCERS (sore throat, sores in mouth, or a toothache) . UNUSUAL RASH, SWELLING OR PAIN  . UNUSUAL VAGINAL DISCHARGE OR ITCHING   Items with * indicate a potential emergency and should be followed up as soon as possible or go to the Emergency Department if any problems should occur.  Please show the CHEMOTHERAPY ALERT CARD  or IMMUNOTHERAPY ALERT CARD at check-in to the Emergency Department and triage nurse.  Should you have questions after your visit or need to cancel or reschedule your appointment, please contact [facility]  Dept: [phone]  and follow the prompts.  Office hours are 8:00 a.m. to 4:30 p.m. Monday - Friday. Please note that voicemails left after 4:00 p.m. may not be returned until the following business day.  We are closed weekends and major holidays. You have access to a nurse at all times for urgent questions. Please call the main number to the clinic Dept: [phone] and follow the prompts.   For any non-urgent questions, you may also contact your provider using MyChart. We now offer e-Visits for anyone 70 and older to request care online for non-urgent symptoms. For details visit [email or web address].   Also download the MyChart app! Go to the app store, search "MyChart", open the app, select [facility], and log in with your MyChart username and password.  Due to Covid, a mask is required upon entering the hospital/clinic. If you do not have a mask, one will be given to you upon arrival. For doctor visits, patients may have 1 support person aged 28 or older with them. For treatment visits, patients cannot have anyone with them due to current Covid guidelines and our immunocompromised population.

## 2020-07-16 NOTE — Progress Notes (Signed)
Patient identified to be at risk for malnutrition on the MST secondary to poor appetite and weight loss.  65 year old female diagnosed with non-small cell lung cancer and followed by Dr. Julien Nordmann.  She is receiving carbo/Taxol and nivolumab.  Past medical history includes anxiety, COPD, and osteoarthritis.  Medications include Zofran, Remeron, multivitamin, fish oil, Compazine, Carafate, turmeric.  Labs include glucose 88 and albumin 3.1.  Height: 5 feet 3 inches. Weight: 131.5 pounds on May 19. Patient weighed 154 pounds August 2021. BMI: 23.29. ECOG: 1.  Patient reports improved appetite. Her nausea has improved and she is able to eat better.  She states she feels better this last 1-1/2 weeks. She is drinking 1 oral nutrition supplement daily and has used both boost and Ensure with good success. States she has not needed Remeron for appetite in the last 1-1/2 weeks.  Nutrition diagnosis: Unintended weight loss related to poor appetite and inadequate oral intake as evidenced by 15% weight loss in 9 months.  Intervention: Support and encouraged patient to increase calories and protein for weight maintenance. Suggested she continue to drink 1 oral nutrition supplement every day.  Provided coupons. Educated on high-protein foods and provided nutrition facts sheets. Questions were answered.  Teach back method used.  Contact information given.  Monitoring, evaluation, goals: Patient will tolerate adequate calories and protein to minimize further weight loss.  Next visit: Follow-up has not been scheduled however RD is happy to see patient if she develops questions or concerns.  **Disclaimer: This note was dictated with voice recognition software. Similar sounding words can inadvertently be transcribed and this note may contain transcription errors which may not have been corrected upon publication of note.**

## 2020-07-18 ENCOUNTER — Other Ambulatory Visit: Payer: Self-pay

## 2020-07-18 ENCOUNTER — Inpatient Hospital Stay: Payer: Medicare Other

## 2020-07-18 VITALS — BP 105/68 | HR 102 | Temp 97.7°F

## 2020-07-18 DIAGNOSIS — Z5112 Encounter for antineoplastic immunotherapy: Secondary | ICD-10-CM | POA: Diagnosis not present

## 2020-07-18 DIAGNOSIS — C3491 Malignant neoplasm of unspecified part of right bronchus or lung: Secondary | ICD-10-CM

## 2020-07-18 MED ORDER — PEGFILGRASTIM-BMEZ 6 MG/0.6ML ~~LOC~~ SOSY
6.0000 mg | PREFILLED_SYRINGE | Freq: Once | SUBCUTANEOUS | Status: AC
  Administered 2020-07-18: 6 mg via SUBCUTANEOUS

## 2020-07-18 NOTE — Patient Instructions (Signed)
Pegfilgrastim injection What is this medicine? PEGFILGRASTIM (PEG fil gra stim) is a long-acting granulocyte colony-stimulating factor that stimulates the growth of neutrophils, a type of white blood cell important in the body's fight against infection. It is used to reduce the incidence of fever and infection in patients with certain types of cancer who are receiving chemotherapy that affects the bone marrow, and to increase survival after being exposed to high doses of radiation. This medicine may be used for other purposes; ask your health care provider or pharmacist if you have questions. COMMON BRAND NAME(S): Fulphila, Neulasta, Nyvepria, UDENYCA, Ziextenzo What should I tell my health care provider before I take this medicine? They need to know if you have any of these conditions:  kidney disease  latex allergy  ongoing radiation therapy  sickle cell disease  skin reactions to acrylic adhesives (On-Body Injector only)  an unusual or allergic reaction to pegfilgrastim, filgrastim, other medicines, foods, dyes, or preservatives  pregnant or trying to get pregnant  breast-feeding How should I use this medicine? This medicine is for injection under the skin. If you get this medicine at home, you will be taught how to prepare and give the pre-filled syringe or how to use the On-body Injector. Refer to the patient Instructions for Use for detailed instructions. Use exactly as directed. Tell your healthcare provider immediately if you suspect that the On-body Injector may not have performed as intended or if you suspect the use of the On-body Injector resulted in a missed or partial dose. It is important that you put your used needles and syringes in a special sharps container. Do not put them in a trash can. If you do not have a sharps container, call your pharmacist or healthcare provider to get one. Talk to your pediatrician regarding the use of this medicine in children. While this drug  may be prescribed for selected conditions, precautions do apply. Overdosage: If you think you have taken too much of this medicine contact a poison control center or emergency room at once. NOTE: This medicine is only for you. Do not share this medicine with others. What if I miss a dose? It is important not to miss your dose. Call your doctor or health care professional if you miss your dose. If you miss a dose due to an On-body Injector failure or leakage, a new dose should be administered as soon as possible using a single prefilled syringe for manual use. What may interact with this medicine? Interactions have not been studied. This list may not describe all possible interactions. Give your health care provider a list of all the medicines, herbs, non-prescription drugs, or dietary supplements you use. Also tell them if you smoke, drink alcohol, or use illegal drugs. Some items may interact with your medicine. What should I watch for while using this medicine? Your condition will be monitored carefully while you are receiving this medicine. You may need blood work done while you are taking this medicine. Talk to your health care provider about your risk of cancer. You may be more at risk for certain types of cancer if you take this medicine. If you are going to need a MRI, CT scan, or other procedure, tell your doctor that you are using this medicine (On-Body Injector only). What side effects may I notice from receiving this medicine? Side effects that you should report to your doctor or health care professional as soon as possible:  allergic reactions (skin rash, itching or hives, swelling of   the face, lips, or tongue)  back pain  dizziness  fever  pain, redness, or irritation at site where injected  pinpoint red spots on the skin  red or dark-brown urine  shortness of breath or breathing problems  stomach or side pain, or pain at the shoulder  swelling  tiredness  trouble  passing urine or change in the amount of urine  unusual bruising or bleeding Side effects that usually do not require medical attention (report to your doctor or health care professional if they continue or are bothersome):  bone pain  muscle pain This list may not describe all possible side effects. Call your doctor for medical advice about side effects. You may report side effects to FDA at 1-800-FDA-1088. Where should I keep my medicine? Keep out of the reach of children. If you are using this medicine at home, you will be instructed on how to store it. Throw away any unused medicine after the expiration date on the label. NOTE: This sheet is a summary. It may not cover all possible information. If you have questions about this medicine, talk to your doctor, pharmacist, or health care provider.  2021 Elsevier/Gold Standard (2019-03-08 13:20:51)  

## 2020-07-23 ENCOUNTER — Inpatient Hospital Stay: Payer: Medicare Other

## 2020-07-23 ENCOUNTER — Other Ambulatory Visit: Payer: Medicare Other

## 2020-07-23 ENCOUNTER — Other Ambulatory Visit: Payer: Self-pay

## 2020-07-23 DIAGNOSIS — C3431 Malignant neoplasm of lower lobe, right bronchus or lung: Secondary | ICD-10-CM

## 2020-07-23 DIAGNOSIS — R630 Anorexia: Secondary | ICD-10-CM

## 2020-07-23 DIAGNOSIS — R634 Abnormal weight loss: Secondary | ICD-10-CM

## 2020-07-23 DIAGNOSIS — Z5112 Encounter for antineoplastic immunotherapy: Secondary | ICD-10-CM | POA: Diagnosis not present

## 2020-07-23 LAB — CBC WITH DIFFERENTIAL (CANCER CENTER ONLY)
Abs Immature Granulocytes: 0.46 10*3/uL — ABNORMAL HIGH (ref 0.00–0.07)
Basophils Absolute: 0.2 10*3/uL — ABNORMAL HIGH (ref 0.0–0.1)
Basophils Relative: 1 %
Eosinophils Absolute: 0.6 10*3/uL — ABNORMAL HIGH (ref 0.0–0.5)
Eosinophils Relative: 3 %
HCT: 35.2 % — ABNORMAL LOW (ref 36.0–46.0)
Hemoglobin: 11.2 g/dL — ABNORMAL LOW (ref 12.0–15.0)
Immature Granulocytes: 2 %
Lymphocytes Relative: 14 %
Lymphs Abs: 2.9 10*3/uL (ref 0.7–4.0)
MCH: 29.6 pg (ref 26.0–34.0)
MCHC: 31.8 g/dL (ref 30.0–36.0)
MCV: 92.9 fL (ref 80.0–100.0)
Monocytes Absolute: 2.5 10*3/uL — ABNORMAL HIGH (ref 0.1–1.0)
Monocytes Relative: 12 %
Neutro Abs: 14.4 10*3/uL — ABNORMAL HIGH (ref 1.7–7.7)
Neutrophils Relative %: 68 %
Platelet Count: 324 10*3/uL (ref 150–400)
RBC: 3.79 MIL/uL — ABNORMAL LOW (ref 3.87–5.11)
RDW: 14.7 % (ref 11.5–15.5)
WBC Count: 20.9 10*3/uL — ABNORMAL HIGH (ref 4.0–10.5)
nRBC: 0 % (ref 0.0–0.2)

## 2020-07-23 LAB — CMP (CANCER CENTER ONLY)
ALT: 36 U/L (ref 0–44)
AST: 31 U/L (ref 15–41)
Albumin: 3.6 g/dL (ref 3.5–5.0)
Alkaline Phosphatase: 136 U/L — ABNORMAL HIGH (ref 38–126)
Anion gap: 8 (ref 5–15)
BUN: 13 mg/dL (ref 8–23)
CO2: 29 mmol/L (ref 22–32)
Calcium: 9.8 mg/dL (ref 8.9–10.3)
Chloride: 103 mmol/L (ref 98–111)
Creatinine: 0.61 mg/dL (ref 0.44–1.00)
GFR, Estimated: >60 mL/min (ref >60)
Glucose, Bld: 100 mg/dL — ABNORMAL HIGH (ref 70–99)
Potassium: 4.3 mmol/L (ref 3.5–5.1)
Sodium: 140 mmol/L (ref 135–145)
Total Bilirubin: <0.2 mg/dL — ABNORMAL LOW (ref 0.3–1.2)
Total Protein: 6.5 g/dL (ref 6.5–8.1)

## 2020-07-30 ENCOUNTER — Other Ambulatory Visit: Payer: Self-pay

## 2020-07-30 ENCOUNTER — Other Ambulatory Visit: Payer: Medicare Other

## 2020-07-30 ENCOUNTER — Inpatient Hospital Stay: Payer: Medicare Other | Attending: Internal Medicine

## 2020-07-30 DIAGNOSIS — Z5189 Encounter for other specified aftercare: Secondary | ICD-10-CM | POA: Diagnosis not present

## 2020-07-30 DIAGNOSIS — C3411 Malignant neoplasm of upper lobe, right bronchus or lung: Secondary | ICD-10-CM | POA: Insufficient documentation

## 2020-07-30 DIAGNOSIS — Z5112 Encounter for antineoplastic immunotherapy: Secondary | ICD-10-CM | POA: Diagnosis present

## 2020-07-30 DIAGNOSIS — J449 Chronic obstructive pulmonary disease, unspecified: Secondary | ICD-10-CM | POA: Insufficient documentation

## 2020-07-30 DIAGNOSIS — R630 Anorexia: Secondary | ICD-10-CM

## 2020-07-30 DIAGNOSIS — Z5111 Encounter for antineoplastic chemotherapy: Secondary | ICD-10-CM | POA: Insufficient documentation

## 2020-07-30 DIAGNOSIS — Z79899 Other long term (current) drug therapy: Secondary | ICD-10-CM | POA: Diagnosis not present

## 2020-07-30 DIAGNOSIS — Z90710 Acquired absence of both cervix and uterus: Secondary | ICD-10-CM | POA: Diagnosis not present

## 2020-07-30 DIAGNOSIS — C3431 Malignant neoplasm of lower lobe, right bronchus or lung: Secondary | ICD-10-CM

## 2020-07-30 DIAGNOSIS — R634 Abnormal weight loss: Secondary | ICD-10-CM

## 2020-07-30 LAB — CBC WITH DIFFERENTIAL (CANCER CENTER ONLY)
Abs Immature Granulocytes: 0.21 10*3/uL — ABNORMAL HIGH (ref 0.00–0.07)
Basophils Absolute: 0.1 10*3/uL (ref 0.0–0.1)
Basophils Relative: 1 %
Eosinophils Absolute: 0.1 10*3/uL (ref 0.0–0.5)
Eosinophils Relative: 1 %
HCT: 37.2 % (ref 36.0–46.0)
Hemoglobin: 11.8 g/dL — ABNORMAL LOW (ref 12.0–15.0)
Immature Granulocytes: 2 %
Lymphocytes Relative: 17 %
Lymphs Abs: 2.2 10*3/uL (ref 0.7–4.0)
MCH: 29.4 pg (ref 26.0–34.0)
MCHC: 31.7 g/dL (ref 30.0–36.0)
MCV: 92.8 fL (ref 80.0–100.0)
Monocytes Absolute: 1.2 10*3/uL — ABNORMAL HIGH (ref 0.1–1.0)
Monocytes Relative: 9 %
Neutro Abs: 9.4 10*3/uL — ABNORMAL HIGH (ref 1.7–7.7)
Neutrophils Relative %: 70 %
Platelet Count: 215 10*3/uL (ref 150–400)
RBC: 4.01 MIL/uL (ref 3.87–5.11)
RDW: 15.8 % — ABNORMAL HIGH (ref 11.5–15.5)
WBC Count: 13.2 10*3/uL — ABNORMAL HIGH (ref 4.0–10.5)
nRBC: 0 % (ref 0.0–0.2)

## 2020-07-30 LAB — CMP (CANCER CENTER ONLY)
ALT: 25 U/L (ref 0–44)
AST: 25 U/L (ref 15–41)
Albumin: 3.6 g/dL (ref 3.5–5.0)
Alkaline Phosphatase: 122 U/L (ref 38–126)
Anion gap: 10 (ref 5–15)
BUN: 18 mg/dL (ref 8–23)
CO2: 26 mmol/L (ref 22–32)
Calcium: 9.4 mg/dL (ref 8.9–10.3)
Chloride: 104 mmol/L (ref 98–111)
Creatinine: 0.61 mg/dL (ref 0.44–1.00)
GFR, Estimated: >60 mL/min (ref >60)
Glucose, Bld: 92 mg/dL (ref 70–99)
Potassium: 4.3 mmol/L (ref 3.5–5.1)
Sodium: 140 mmol/L (ref 135–145)
Total Bilirubin: 0.2 mg/dL — ABNORMAL LOW (ref 0.3–1.2)
Total Protein: 6.5 g/dL (ref 6.5–8.1)

## 2020-08-06 ENCOUNTER — Inpatient Hospital Stay: Payer: Medicare Other

## 2020-08-06 ENCOUNTER — Inpatient Hospital Stay (HOSPITAL_BASED_OUTPATIENT_CLINIC_OR_DEPARTMENT_OTHER): Payer: Medicare Other | Admitting: Internal Medicine

## 2020-08-06 ENCOUNTER — Other Ambulatory Visit: Payer: Medicare Other

## 2020-08-06 ENCOUNTER — Other Ambulatory Visit: Payer: Self-pay

## 2020-08-06 VITALS — BP 118/71 | HR 85 | Temp 97.0°F | Resp 18 | Ht 63.0 in | Wt 133.0 lb

## 2020-08-06 DIAGNOSIS — R630 Anorexia: Secondary | ICD-10-CM

## 2020-08-06 DIAGNOSIS — C3491 Malignant neoplasm of unspecified part of right bronchus or lung: Secondary | ICD-10-CM

## 2020-08-06 DIAGNOSIS — Z5112 Encounter for antineoplastic immunotherapy: Secondary | ICD-10-CM | POA: Diagnosis not present

## 2020-08-06 DIAGNOSIS — Z5111 Encounter for antineoplastic chemotherapy: Secondary | ICD-10-CM

## 2020-08-06 DIAGNOSIS — C3490 Malignant neoplasm of unspecified part of unspecified bronchus or lung: Secondary | ICD-10-CM

## 2020-08-06 DIAGNOSIS — C3431 Malignant neoplasm of lower lobe, right bronchus or lung: Secondary | ICD-10-CM

## 2020-08-06 DIAGNOSIS — R634 Abnormal weight loss: Secondary | ICD-10-CM

## 2020-08-06 LAB — CMP (CANCER CENTER ONLY)
ALT: 19 U/L (ref 0–44)
AST: 22 U/L (ref 15–41)
Albumin: 3.7 g/dL (ref 3.5–5.0)
Alkaline Phosphatase: 95 U/L (ref 38–126)
Anion gap: 12 (ref 5–15)
BUN: 16 mg/dL (ref 8–23)
CO2: 26 mmol/L (ref 22–32)
Calcium: 9.7 mg/dL (ref 8.9–10.3)
Chloride: 104 mmol/L (ref 98–111)
Creatinine: 0.61 mg/dL (ref 0.44–1.00)
GFR, Estimated: >60 mL/min (ref >60)
Glucose, Bld: 93 mg/dL (ref 70–99)
Potassium: 4.5 mmol/L (ref 3.5–5.1)
Sodium: 142 mmol/L (ref 135–145)
Total Bilirubin: 0.5 mg/dL (ref 0.3–1.2)
Total Protein: 6.6 g/dL (ref 6.5–8.1)

## 2020-08-06 LAB — CBC WITH DIFFERENTIAL (CANCER CENTER ONLY)
Abs Immature Granulocytes: 0.02 10*3/uL (ref 0.00–0.07)
Basophils Absolute: 0.1 10*3/uL (ref 0.0–0.1)
Basophils Relative: 1 %
Eosinophils Absolute: 0.1 10*3/uL (ref 0.0–0.5)
Eosinophils Relative: 1 %
HCT: 36.2 % (ref 36.0–46.0)
Hemoglobin: 11.6 g/dL — ABNORMAL LOW (ref 12.0–15.0)
Immature Granulocytes: 0 %
Lymphocytes Relative: 22 %
Lymphs Abs: 1.5 10*3/uL (ref 0.7–4.0)
MCH: 29.7 pg (ref 26.0–34.0)
MCHC: 32 g/dL (ref 30.0–36.0)
MCV: 92.8 fL (ref 80.0–100.0)
Monocytes Absolute: 1 10*3/uL (ref 0.1–1.0)
Monocytes Relative: 14 %
Neutro Abs: 4.3 10*3/uL (ref 1.7–7.7)
Neutrophils Relative %: 62 %
Platelet Count: 271 10*3/uL (ref 150–400)
RBC: 3.9 MIL/uL (ref 3.87–5.11)
RDW: 16.8 % — ABNORMAL HIGH (ref 11.5–15.5)
WBC Count: 6.9 10*3/uL (ref 4.0–10.5)
nRBC: 0 % (ref 0.0–0.2)

## 2020-08-06 LAB — TSH: TSH: 1.846 u[IU]/mL (ref 0.308–3.960)

## 2020-08-06 MED ORDER — SODIUM CHLORIDE 0.9 % IV SOLN
Freq: Once | INTRAVENOUS | Status: AC
  Filled 2020-08-06: qty 250

## 2020-08-06 MED ORDER — DIPHENHYDRAMINE HCL 50 MG/ML IJ SOLN
INTRAMUSCULAR | Status: AC
  Filled 2020-08-06: qty 1

## 2020-08-06 MED ORDER — SODIUM CHLORIDE 0.9 % IV SOLN
10.0000 mg | Freq: Once | INTRAVENOUS | Status: AC
Start: 2020-08-06 — End: 2020-08-06
  Administered 2020-08-06: 10 mg via INTRAVENOUS
  Filled 2020-08-06: qty 10

## 2020-08-06 MED ORDER — FAMOTIDINE 20 MG IN NS 100 ML IVPB
INTRAVENOUS | Status: AC
  Filled 2020-08-06: qty 100

## 2020-08-06 MED ORDER — SODIUM CHLORIDE 0.9 % IV SOLN
175.0000 mg/m2 | Freq: Once | INTRAVENOUS | Status: AC
  Administered 2020-08-06: 288 mg via INTRAVENOUS
  Filled 2020-08-06: qty 48

## 2020-08-06 MED ORDER — PALONOSETRON HCL INJECTION 0.25 MG/5ML
0.2500 mg | Freq: Once | INTRAVENOUS | Status: AC
  Administered 2020-08-06: 0.25 mg via INTRAVENOUS

## 2020-08-06 MED ORDER — DIPHENHYDRAMINE HCL 50 MG/ML IJ SOLN
50.0000 mg | Freq: Once | INTRAMUSCULAR | Status: AC
  Administered 2020-08-06: 50 mg via INTRAVENOUS

## 2020-08-06 MED ORDER — SODIUM CHLORIDE 0.9 % IV SOLN
400.0000 mg | Freq: Once | INTRAVENOUS | Status: AC
  Administered 2020-08-06: 400 mg via INTRAVENOUS
  Filled 2020-08-06: qty 40

## 2020-08-06 MED ORDER — FAMOTIDINE 20 MG IN NS 100 ML IVPB
20.0000 mg | Freq: Once | INTRAVENOUS | Status: AC
  Administered 2020-08-06: 20 mg via INTRAVENOUS

## 2020-08-06 MED ORDER — PALONOSETRON HCL INJECTION 0.25 MG/5ML
INTRAVENOUS | Status: AC
  Filled 2020-08-06: qty 5

## 2020-08-06 MED ORDER — SODIUM CHLORIDE 0.9 % IV SOLN
150.0000 mg | Freq: Once | INTRAVENOUS | Status: AC
  Administered 2020-08-06: 150 mg via INTRAVENOUS
  Filled 2020-08-06: qty 150

## 2020-08-06 MED ORDER — NIVOLUMAB CHEMO INJECTION 100 MG/10ML
360.0000 mg | Freq: Once | INTRAVENOUS | Status: AC
  Administered 2020-08-06: 360 mg via INTRAVENOUS
  Filled 2020-08-06: qty 24

## 2020-08-06 NOTE — Progress Notes (Signed)
[facility] [phone]   [phone]  OFFICE PROGRESS NOTE  Hayden Rasmussen, MD [address] [place] [address]  DIAGNOSIS: Stage IIIB (T4, N2, M0) Non-Small Cell Lung Cancer, Squamous cell carcinoma.  She presented with a large centrally necrotic right upper lobe lung mass that encases the right hilum with postobstructive atelectasis in the right middle lobe.  There is also enlarged subcarinal lymphadenopathy and equivocal uptake in the right paratracheal and left prevascular lymph nodes.  There was also a separate posterior medial right upper lobe nodule. Unable to exclude involvement in the right hemidiaphragm. She was diagnosed in April 2022.  PRIOR THERAPY: None  CURRENT THERAPY: Neoadjuvant systemic chemotherapy with carboplatin for AUC of 5, paclitaxel 175 Mg/M2 and nivolumab 360 mg IV every 3 weeks.  First dose June 25, 2020.  Status post 1 cycle.  INTERVAL HISTORY: [name] 65 y.o. female returns to the clinic today for follow-up visit.  The patient is feeling fine today with no concerning complaints except for mild fatigue for few days after the Neulasta injection.  She is using Claritin and Tylenol at that time.  She denied having any current chest pain, shortness of breath, cough or hemoptysis.  She denied having any fever or chills.  She has no nausea, vomiting, diarrhea or constipation.  She denied having any headache or visual changes.  She is here today for evaluation before starting cycle #3 of her treatment.  MEDICAL HISTORY: Past Medical History:  Diagnosis Date   Anxiety    Asthma    Colon polyps    COPD (chronic obstructive pulmonary disease) ([facility])    patient denies this dx   Dyspnea    with exertion   Osteoarthritis    Pneumonia    Right lower lobe lung mass     ALLERGIES:  is allergic to morphine and related and percocet [oxycodone-acetaminophen].  MEDICATIONS:  Current Outpatient  Medications  Medication Sig Dispense Refill   albuterol (VENTOLIN HFA) 108 (90 Base) MCG/ACT inhaler Inhale 1-2 puffs into the lungs every 4 (four) hours as needed for wheezing or shortness of breath.     ASHWAGANDHA PO Take 1 capsule by mouth daily.     BREZTRI AEROSPHERE 160-9-4.8 MCG/ACT AERO Take 1 puff by mouth 2 (two) times daily.     Budeson-Glycopyrrol-Formoterol (BREZTRI AEROSPHERE) 160-9-4.8 MCG/ACT AERO 2 puffs     clonazePAM (KLONOPIN) 0.5 MG tablet Take 0.25-0.5 mg by mouth at bedtime as needed.     fluticasone (FLONASE) 50 MCG/ACT nasal spray Place 1 spray into both nostrils daily as needed for allergies.     folic acid (FOLVITE) 270 MCG tablet Take 400 mcg by mouth daily.     Glucosamine-Chondroitin (OSTEO BI-FLEX REGULAR STRENGTH PO) Take 2 tablets by mouth daily.     ibuprofen (ADVIL) 200 MG tablet Take 400 mg by mouth every 6 (six) hours as needed for headache or moderate pain.     mirtazapine (REMERON) 15 MG tablet Take 1 tablet (15 mg total) by mouth at bedtime. 30 tablet 2   Multiple Minerals-Vitamins (MULTISOURCE CALCIUM MAG/D) TABS Take 1 tablet by mouth daily.     Omega-3 Fatty Acids (FISH OIL) 1000 MG CAPS Take 1,000 mg by mouth daily.     ondansetron (ZOFRAN) 8 MG tablet Take 1 tablet (8 mg total) by mouth every 8 (eight) hours as needed for nausea or vomiting. 20 tablet 0   prochlorperazine (COMPAZINE) 10  MG tablet Take 1 tablet (10 mg total) by mouth every 6 (six) hours as needed. 30 tablet 2   sucralfate (CARAFATE) 1 g tablet Take 1 g by mouth 2 (two) times daily.     TURMERIC PO Take 1 tablet by mouth daily.     No current facility-administered medications for this visit.    SURGICAL HISTORY:  Past Surgical History:  Procedure Laterality Date   ABDOMINAL HYSTERECTOMY N/A 04/28/2015   Procedure: TOTAL HYSTERECTOMY ABDOMINAL;  Surgeon: Nunzio Cobbs, MD;  Location: [facility];  Service: Gynecology;  Laterality: N/A;   BRONCHIAL BIOPSY  06/11/2020    Procedure: BRONCHIAL BIOPSIES;  Surgeon: Collene Gobble, MD;  Location: [facility];  Service: Cardiopulmonary;;   BRONCHIAL BRUSHINGS  06/11/2020   Procedure: BRONCHIAL BRUSHINGS;  Surgeon: Collene Gobble, MD;  Location: [facility];  Service: Cardiopulmonary;;   BRONCHIAL NEEDLE ASPIRATION BIOPSY  06/11/2020   Procedure: BRONCHIAL NEEDLE ASPIRATION BIOPSIES;  Surgeon: Collene Gobble, MD;  Location: [facility];  Service: Cardiopulmonary;;   BRONCHIAL WASHINGS  06/11/2020   Procedure: BRONCHIAL WASHINGS;  Surgeon: Collene Gobble, MD;  [facility];  Service: Cardiopulmonary;;   BUNIONECTOMY Bilateral    COLONOSCOPY     rectal nodule removal  05/2012   --benign with Dr. Elmo Putt Thomas([facility])   SALPINGOOPHORECTOMY Bilateral 04/28/2015   Procedure: BILATERAL SALPINGO OOPHORECTOMY WITH PELVIC WASHINGS ;  Surgeon: Nunzio Cobbs, MD;  Location: [facility];  Service: Gynecology;  Laterality: Bilateral;   VIDEO BRONCHOSCOPY WITH ENDOBRONCHIAL ULTRASOUND N/A 06/11/2020   Procedure: VIDEO BRONCHOSCOPY WITH ENDOBRONCHIAL ULTRASOUND;  Surgeon: Collene Gobble, MD;  Location: [facility];  Service: Cardiopulmonary;  Laterality: N/A;   WISDOM TOOTH EXTRACTION      REVIEW OF SYSTEMS:  A comprehensive review of systems was negative except for: Constitutional: positive for fatigue   PHYSICAL EXAMINATION: General appearance: alert, cooperative, fatigued, and no distress Head: Normocephalic, without obvious abnormality, atraumatic Neck: no adenopathy, no JVD, supple, symmetrical, trachea midline, and thyroid not enlarged, symmetric, no tenderness/mass/nodules Lymph nodes: Cervical, supraclavicular, and axillary nodes normal. Resp: clear to auscultation bilaterally Back: symmetric, no curvature. ROM normal. No CVA tenderness. Cardio: regular rate and rhythm, S1, S2 normal, no murmur, click, rub or gallop GI: soft, non-tender; bowel sounds normal; no masses,  no organomegaly Extremities:  extremities normal, atraumatic, no cyanosis or edema  ECOG PERFORMANCE STATUS: 1 - Symptomatic but completely ambulatory  Blood pressure 118/71, pulse 85, temperature (!) 97 F (36.1 C), temperature source Tympanic, resp. rate 18, height 5\' 3"  (1.6 m), weight 133 lb (60.3 kg), last menstrual period 02/28/2009, SpO2 100 %.  LABORATORY DATA: Lab Results  Component Value Date   WBC 6.9 08/06/2020   HGB 11.6 (L) 08/06/2020   HCT 36.2 08/06/2020   MCV 92.8 08/06/2020   PLT 271 08/06/2020      Chemistry      Component Value Date/Time   NA 140 07/30/2020 1008   K 4.3 07/30/2020 1008   CL 104 07/30/2020 1008   CO2 26 07/30/2020 1008   BUN 18 07/30/2020 1008   CREATININE 0.61 07/30/2020 1008      Component Value Date/Time   CALCIUM 9.4 07/30/2020 1008   ALKPHOS 122 07/30/2020 1008   AST 25 07/30/2020 1008   ALT 25 07/30/2020 1008   BILITOT 0.2 (L) 07/30/2020 1008       RADIOGRAPHIC STUDIES: No results found.   ASSESSMENT AND PLAN: This is a very pleasant 65  years old white female recently diagnosed with a stage IIIb (T4, N2, M0) non-small cell lung cancer, squamous cell carcinoma presented with large centrally necrotic right upper lobe lung mass encasing the right hilum and close postobstructive atelectasis in the right middle lobe as well as enlargement subcarinal lymphadenopathy and addition to right paratracheal and left prevascular lymph nodes diagnosed in April 2022.  The patient is currently undergoing neoadjuvant systemic chemotherapy with carboplatin for AUC of 5, paclitaxel 175 Mg/M2 and nivolumab 360 mg IV every 3 weeks with Neulasta support status post 2 cycles.  The patient continues to tolerate her treatment well with no concerning adverse effects. I recommended for her to proceed with cycle #3 today as planned. I will see her back for follow-up visit in 4 weeks for evaluation with repeat CT scan of the chest for restaging of her disease before evaluation of surgical  resection by Dr. Warrick Parisian at [facility]. The patient was advised to call immediately if she has any other concerning symptoms in the interval. The patient voices understanding of current disease status and treatment options and is in agreement with the current care plan.  All questions were answered. The patient knows to call the clinic with any problems, questions or concerns. We can certainly see the patient much sooner if necessary. The total time spent in the appointment was 20 minutes.  Disclaimer: This note was dictated with voice recognition software. Similar sounding words can inadvertently be transcribed and may not be corrected upon review.

## 2020-08-06 NOTE — Patient Instructions (Signed)
[facility] ONCOLOGY  Discharge Instructions: Thank you for choosing [facility] to provide your oncology and hematology care.   If you have a lab appointment with the [facility], please go directly to the [facility] and check in at the registration area.   Wear comfortable clothing and clothing appropriate for easy access to any Portacath or PICC line.   We strive to give you quality time with your provider. You may need to reschedule your appointment if you arrive late (15 or more minutes).  Arriving late affects you and other patients whose appointments are after yours.  Also, if you miss three or more appointments without notifying the office, you may be dismissed from the clinic at the provider's discretion.      For prescription refill requests, have your pharmacy contact our office and allow 72 hours for refills to be completed.    Today you received the following chemotherapy and/or immunotherapy agents Opdivo, Taxol, Carboplatin    To help prevent nausea and vomiting after your treatment, we encourage you to take your nausea medication as directed.  BELOW ARE SYMPTOMS THAT SHOULD BE REPORTED IMMEDIATELY: *FEVER GREATER THAN 100.4 F (38 C) OR HIGHER *CHILLS OR SWEATING *NAUSEA AND VOMITING THAT IS NOT CONTROLLED WITH YOUR NAUSEA MEDICATION *UNUSUAL SHORTNESS OF BREATH *UNUSUAL BRUISING OR BLEEDING *URINARY PROBLEMS (pain or burning when urinating, or frequent urination) *BOWEL PROBLEMS (unusual diarrhea, constipation, pain near the anus) TENDERNESS IN MOUTH AND THROAT WITH OR WITHOUT PRESENCE OF ULCERS (sore throat, sores in mouth, or a toothache) UNUSUAL RASH, SWELLING OR PAIN  UNUSUAL VAGINAL DISCHARGE OR ITCHING   Items with * indicate a potential emergency and should be followed up as soon as possible or go to the Emergency Department if any problems should occur.  Please show the CHEMOTHERAPY ALERT CARD or IMMUNOTHERAPY ALERT CARD  at check-in to the Emergency Department and triage nurse.  Should you have questions after your visit or need to cancel or reschedule your appointment, please contact [facility]  Dept: [phone]  and follow the prompts.  Office hours are 8:00 a.m. to 4:30 p.m. Monday - Friday. Please note that voicemails left after 4:00 p.m. may not be returned until the following business day.  We are closed weekends and major holidays. You have access to a nurse at all times for urgent questions. Please call the main number to the clinic Dept: [phone] and follow the prompts.   For any non-urgent questions, you may also contact your provider using MyChart. We now offer e-Visits for anyone 60 and older to request care online for non-urgent symptoms. For details visit [email or web address].   Also download the MyChart app! Go to the app store, search "MyChart", open the app, select [facility], and log in with your MyChart username and password.  Due to Covid, a mask is required upon entering the hospital/clinic. If you do not have a mask, one will be given to you upon arrival. For doctor visits, patients may have 1 support person aged 40 or older with them. For treatment visits, patients cannot have anyone with them due to current Covid guidelines and our immunocompromised population.

## 2020-08-08 ENCOUNTER — Inpatient Hospital Stay: Payer: Medicare Other

## 2020-08-08 ENCOUNTER — Other Ambulatory Visit: Payer: Self-pay

## 2020-08-08 VITALS — BP 111/73 | HR 97 | Temp 97.0°F | Resp 17

## 2020-08-08 DIAGNOSIS — Z5112 Encounter for antineoplastic immunotherapy: Secondary | ICD-10-CM | POA: Diagnosis not present

## 2020-08-08 DIAGNOSIS — C3491 Malignant neoplasm of unspecified part of right bronchus or lung: Secondary | ICD-10-CM

## 2020-08-08 MED ORDER — PEGFILGRASTIM-BMEZ 6 MG/0.6ML ~~LOC~~ SOSY
6.0000 mg | PREFILLED_SYRINGE | Freq: Once | SUBCUTANEOUS | Status: AC
Start: 2020-08-08 — End: 2020-08-08
  Administered 2020-08-08: 6 mg via SUBCUTANEOUS

## 2020-08-11 ENCOUNTER — Telehealth: Payer: Self-pay | Admitting: Internal Medicine

## 2020-08-11 NOTE — Telephone Encounter (Signed)
Scheduled per los. Called and spoke with patient. Confirmed appt 

## 2020-08-14 ENCOUNTER — Other Ambulatory Visit: Payer: Self-pay

## 2020-08-14 ENCOUNTER — Inpatient Hospital Stay: Payer: Medicare Other

## 2020-08-14 DIAGNOSIS — R634 Abnormal weight loss: Secondary | ICD-10-CM

## 2020-08-14 DIAGNOSIS — R630 Anorexia: Secondary | ICD-10-CM

## 2020-08-14 DIAGNOSIS — Z5112 Encounter for antineoplastic immunotherapy: Secondary | ICD-10-CM | POA: Diagnosis not present

## 2020-08-14 DIAGNOSIS — C3431 Malignant neoplasm of lower lobe, right bronchus or lung: Secondary | ICD-10-CM

## 2020-08-14 LAB — CMP (CANCER CENTER ONLY)
ALT: 25 U/L (ref 0–44)
AST: 24 U/L (ref 15–41)
Albumin: 4.1 g/dL (ref 3.5–5.0)
Alkaline Phosphatase: 130 U/L — ABNORMAL HIGH (ref 38–126)
Anion gap: 8 (ref 5–15)
BUN: 13 mg/dL (ref 8–23)
CO2: 29 mmol/L (ref 22–32)
Calcium: 9.9 mg/dL (ref 8.9–10.3)
Chloride: 102 mmol/L (ref 98–111)
Creatinine: 0.55 mg/dL (ref 0.44–1.00)
GFR, Estimated: >60 mL/min (ref >60)
Glucose, Bld: 87 mg/dL (ref 70–99)
Potassium: 4.3 mmol/L (ref 3.5–5.1)
Sodium: 139 mmol/L (ref 135–145)
Total Bilirubin: 0.3 mg/dL (ref 0.3–1.2)
Total Protein: 6.6 g/dL (ref 6.5–8.1)

## 2020-08-14 LAB — CBC WITH DIFFERENTIAL (CANCER CENTER ONLY)
Abs Immature Granulocytes: 1.48 10*3/uL — ABNORMAL HIGH (ref 0.00–0.07)
Basophils Absolute: 0.1 10*3/uL (ref 0.0–0.1)
Basophils Relative: 0 %
Eosinophils Absolute: 0.5 10*3/uL (ref 0.0–0.5)
Eosinophils Relative: 2 %
HCT: 34.9 % — ABNORMAL LOW (ref 36.0–46.0)
Hemoglobin: 11.1 g/dL — ABNORMAL LOW (ref 12.0–15.0)
Immature Granulocytes: 6 %
Lymphocytes Relative: 15 %
Lymphs Abs: 3.6 10*3/uL (ref 0.7–4.0)
MCH: 29.7 pg (ref 26.0–34.0)
MCHC: 31.8 g/dL (ref 30.0–36.0)
MCV: 93.3 fL (ref 80.0–100.0)
Monocytes Absolute: 3.4 10*3/uL — ABNORMAL HIGH (ref 0.1–1.0)
Monocytes Relative: 14 %
Neutro Abs: 15.3 10*3/uL — ABNORMAL HIGH (ref 1.7–7.7)
Neutrophils Relative %: 63 %
Platelet Count: 292 10*3/uL (ref 150–400)
RBC: 3.74 MIL/uL — ABNORMAL LOW (ref 3.87–5.11)
RDW: 17.2 % — ABNORMAL HIGH (ref 11.5–15.5)
WBC Count: 24.3 10*3/uL — ABNORMAL HIGH (ref 4.0–10.5)
nRBC: 0.1 % (ref 0.0–0.2)

## 2020-08-20 ENCOUNTER — Inpatient Hospital Stay: Payer: Medicare Other

## 2020-08-20 ENCOUNTER — Other Ambulatory Visit: Payer: Self-pay

## 2020-08-20 DIAGNOSIS — R634 Abnormal weight loss: Secondary | ICD-10-CM

## 2020-08-20 DIAGNOSIS — C3431 Malignant neoplasm of lower lobe, right bronchus or lung: Secondary | ICD-10-CM

## 2020-08-20 DIAGNOSIS — R630 Anorexia: Secondary | ICD-10-CM

## 2020-08-20 DIAGNOSIS — Z5112 Encounter for antineoplastic immunotherapy: Secondary | ICD-10-CM | POA: Diagnosis not present

## 2020-08-20 LAB — CMP (CANCER CENTER ONLY)
ALT: 20 U/L (ref 0–44)
AST: 22 U/L (ref 15–41)
Albumin: 3.7 g/dL (ref 3.5–5.0)
Alkaline Phosphatase: 119 U/L (ref 38–126)
Anion gap: 8 (ref 5–15)
BUN: 17 mg/dL (ref 8–23)
CO2: 28 mmol/L (ref 22–32)
Calcium: 9.7 mg/dL (ref 8.9–10.3)
Chloride: 105 mmol/L (ref 98–111)
Creatinine: 0.61 mg/dL (ref 0.44–1.00)
GFR, Estimated: >60 mL/min (ref >60)
Glucose, Bld: 89 mg/dL (ref 70–99)
Potassium: 4.3 mmol/L (ref 3.5–5.1)
Sodium: 141 mmol/L (ref 135–145)
Total Bilirubin: 0.4 mg/dL (ref 0.3–1.2)
Total Protein: 6.6 g/dL (ref 6.5–8.1)

## 2020-08-20 LAB — CBC WITH DIFFERENTIAL (CANCER CENTER ONLY)
Abs Immature Granulocytes: 0.11 10*3/uL — ABNORMAL HIGH (ref 0.00–0.07)
Basophils Absolute: 0.1 10*3/uL (ref 0.0–0.1)
Basophils Relative: 0 %
Eosinophils Absolute: 0 10*3/uL (ref 0.0–0.5)
Eosinophils Relative: 0 %
HCT: 36.2 % (ref 36.0–46.0)
Hemoglobin: 11.8 g/dL — ABNORMAL LOW (ref 12.0–15.0)
Immature Granulocytes: 1 %
Lymphocytes Relative: 18 %
Lymphs Abs: 2.1 10*3/uL (ref 0.7–4.0)
MCH: 30.5 pg (ref 26.0–34.0)
MCHC: 32.6 g/dL (ref 30.0–36.0)
MCV: 93.5 fL (ref 80.0–100.0)
Monocytes Absolute: 1.1 10*3/uL — ABNORMAL HIGH (ref 0.1–1.0)
Monocytes Relative: 9 %
Neutro Abs: 8.5 10*3/uL — ABNORMAL HIGH (ref 1.7–7.7)
Neutrophils Relative %: 72 %
Platelet Count: 227 10*3/uL (ref 150–400)
RBC: 3.87 MIL/uL (ref 3.87–5.11)
RDW: 18.4 % — ABNORMAL HIGH (ref 11.5–15.5)
WBC Count: 11.9 10*3/uL — ABNORMAL HIGH (ref 4.0–10.5)
nRBC: 0 % (ref 0.0–0.2)

## 2020-08-25 ENCOUNTER — Telehealth: Payer: Self-pay | Admitting: Medical Oncology

## 2020-08-25 NOTE — Telephone Encounter (Signed)
Surgery at [facility] on 08/29/20-Pt requested to please cancel CT and f/u with [facility].

## 2020-08-27 ENCOUNTER — Other Ambulatory Visit: Payer: Self-pay

## 2020-08-27 ENCOUNTER — Inpatient Hospital Stay: Payer: Medicare Other

## 2020-08-27 DIAGNOSIS — R630 Anorexia: Secondary | ICD-10-CM

## 2020-08-27 DIAGNOSIS — R634 Abnormal weight loss: Secondary | ICD-10-CM

## 2020-08-27 DIAGNOSIS — Z5112 Encounter for antineoplastic immunotherapy: Secondary | ICD-10-CM | POA: Diagnosis not present

## 2020-08-27 DIAGNOSIS — C3431 Malignant neoplasm of lower lobe, right bronchus or lung: Secondary | ICD-10-CM

## 2020-08-27 DIAGNOSIS — C3490 Malignant neoplasm of unspecified part of unspecified bronchus or lung: Secondary | ICD-10-CM

## 2020-08-27 LAB — CBC WITH DIFFERENTIAL (CANCER CENTER ONLY)
Abs Immature Granulocytes: 0.03 10*3/uL (ref 0.00–0.07)
Basophils Absolute: 0.1 10*3/uL (ref 0.0–0.1)
Basophils Relative: 1 %
Eosinophils Absolute: 0 10*3/uL (ref 0.0–0.5)
Eosinophils Relative: 0 %
HCT: 35.4 % — ABNORMAL LOW (ref 36.0–46.0)
Hemoglobin: 11.7 g/dL — ABNORMAL LOW (ref 12.0–15.0)
Immature Granulocytes: 0 %
Lymphocytes Relative: 23 %
Lymphs Abs: 1.8 10*3/uL (ref 0.7–4.0)
MCH: 30.8 pg (ref 26.0–34.0)
MCHC: 33.1 g/dL (ref 30.0–36.0)
MCV: 93.2 fL (ref 80.0–100.0)
Monocytes Absolute: 1 10*3/uL (ref 0.1–1.0)
Monocytes Relative: 12 %
Neutro Abs: 5.1 10*3/uL (ref 1.7–7.7)
Neutrophils Relative %: 64 %
Platelet Count: 256 10*3/uL (ref 150–400)
RBC: 3.8 MIL/uL — ABNORMAL LOW (ref 3.87–5.11)
RDW: 18.5 % — ABNORMAL HIGH (ref 11.5–15.5)
WBC Count: 8 10*3/uL (ref 4.0–10.5)
nRBC: 0 % (ref 0.0–0.2)

## 2020-08-27 LAB — CMP (CANCER CENTER ONLY)
ALT: 14 U/L (ref 0–44)
AST: 20 U/L (ref 15–41)
Albumin: 4 g/dL (ref 3.5–5.0)
Alkaline Phosphatase: 102 U/L (ref 38–126)
Anion gap: 8 (ref 5–15)
BUN: 16 mg/dL (ref 8–23)
CO2: 28 mmol/L (ref 22–32)
Calcium: 9.8 mg/dL (ref 8.9–10.3)
Chloride: 105 mmol/L (ref 98–111)
Creatinine: 0.6 mg/dL (ref 0.44–1.00)
GFR, Estimated: >60 mL/min (ref >60)
Glucose, Bld: 86 mg/dL (ref 70–99)
Potassium: 4.2 mmol/L (ref 3.5–5.1)
Sodium: 141 mmol/L (ref 135–145)
Total Bilirubin: 0.7 mg/dL (ref 0.3–1.2)
Total Protein: 6.6 g/dL (ref 6.5–8.1)

## 2020-08-27 LAB — TSH: TSH: 1.591 u[IU]/mL (ref 0.308–3.960)

## 2020-09-01 ENCOUNTER — Ambulatory Visit (HOSPITAL_COMMUNITY): Payer: Medicare Other

## 2020-09-03 ENCOUNTER — Telehealth: Payer: Self-pay | Admitting: Internal Medicine

## 2020-09-03 ENCOUNTER — Inpatient Hospital Stay: Payer: Medicare Other | Admitting: Internal Medicine

## 2020-09-03 NOTE — Telephone Encounter (Signed)
Scheduled appt per 7/7 sch msg. Pt aware.

## 2020-09-13 ENCOUNTER — Other Ambulatory Visit: Payer: Self-pay | Admitting: Physician Assistant

## 2020-09-13 DIAGNOSIS — R634 Abnormal weight loss: Secondary | ICD-10-CM

## 2020-09-13 DIAGNOSIS — C3431 Malignant neoplasm of lower lobe, right bronchus or lung: Secondary | ICD-10-CM

## 2020-09-13 DIAGNOSIS — R630 Anorexia: Secondary | ICD-10-CM

## 2020-10-06 ENCOUNTER — Ambulatory Visit: Payer: Medicare Other | Admitting: Internal Medicine

## 2021-08-27 ENCOUNTER — Other Ambulatory Visit: Payer: Self-pay | Admitting: Physician Assistant

## 2021-10-07 ENCOUNTER — Encounter: Payer: Self-pay | Admitting: Gastroenterology

## 2021-10-27 ENCOUNTER — Encounter: Payer: Self-pay | Admitting: Gastroenterology

## 2021-11-10 ENCOUNTER — Ambulatory Visit (AMBULATORY_SURGERY_CENTER): Payer: Self-pay | Admitting: *Deleted

## 2021-11-10 VITALS — Ht 62.0 in | Wt 139.8 lb

## 2021-11-10 DIAGNOSIS — Z86010 Personal history of colonic polyps: Secondary | ICD-10-CM

## 2021-11-10 MED ORDER — NA SULFATE-K SULFATE-MG SULF 17.5-3.13-1.6 GM/177ML PO SOLN: 1.0000 | Freq: Once | ORAL | 0 refills | Status: AC

## 2021-11-10 NOTE — Progress Notes (Signed)
No egg or soy allergy known to patient  No issues known to pt with past sedation with any surgeries or procedures Patient denies ever being told they had issues or difficulty with intubation  No FH of Malignant Hyperthermia Pt is not on diet pills Pt is not on  home 02  Pt is not on blood thinners  Pt denies issues with constipation  No A fib or A flutter Have any cardiac testing pending--no Pt instructed to use Singlecare.[email or web address] or GoodRx for a price reduction on prep   

## 2021-11-17 ENCOUNTER — Encounter: Payer: Self-pay | Admitting: Gastroenterology

## 2021-11-18 IMAGING — CT NM PET TUM IMG INITIAL (PI) SKULL BASE T - THIGH
7 series · 25 of 25 positions shown · non-contrast
Comparison: None.

CLINICAL DATA: Initial treatment strategy for Lung cancer.

EXAM:
NUCLEAR MEDICINE PET SKULL BASE TO THIGH
TECHNIQUE: 6.8 mCi F-18 FDG was injected intravenously. Full-ring PET imaging
was performed from the skull base to thigh after the radiotracer. CT
data was obtained and used for attenuation correction and anatomic
localization.
Fasting blood glucose: 116 mg/dl

[Series 3: pet sk_thigh ac · axial · 5.0mm · 4.07mm/px · z∈[-1444,-604]mm · 6 of 211 slices shown]
[im 1/211]
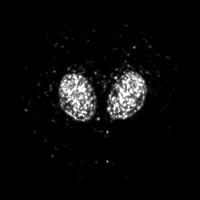
[im 43/211]
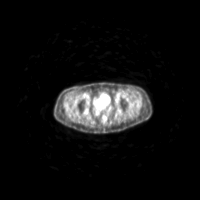
[im 85/211]
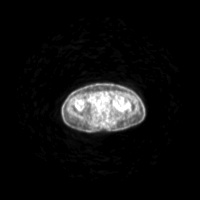
[im 127/211]
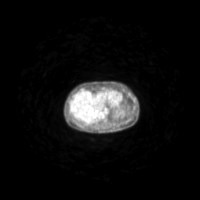
[im 169/211]
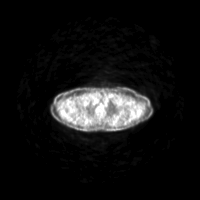
[im 211/211]
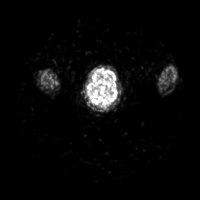

[Series 4: ct sk_thigh 5.0 bf37 · axial · 5.0mm · 0.98mm/px · z∈[-1444,-604]mm · 5 of 211 slices shown]
[im 1/211]
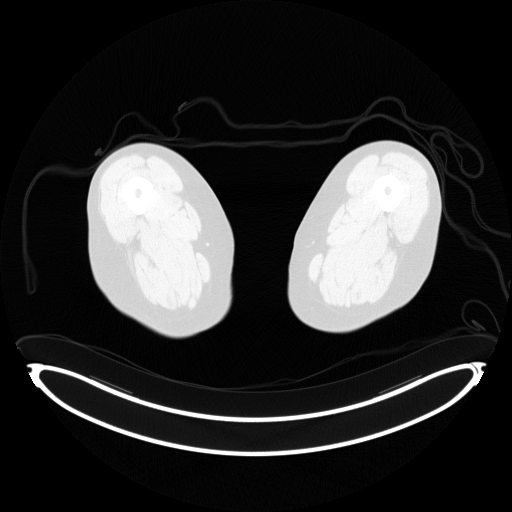
[im 53/211]
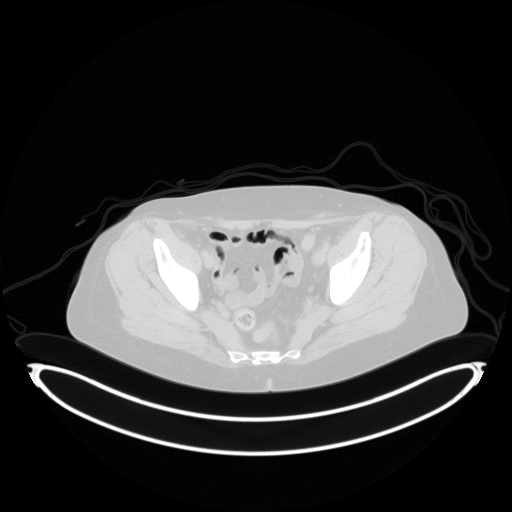
[im 106/211]
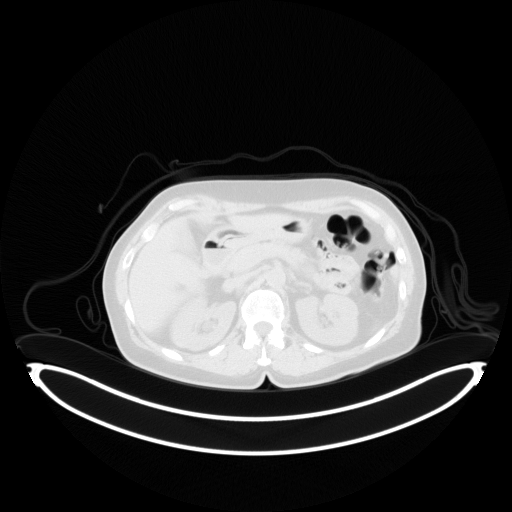
[im 158/211]
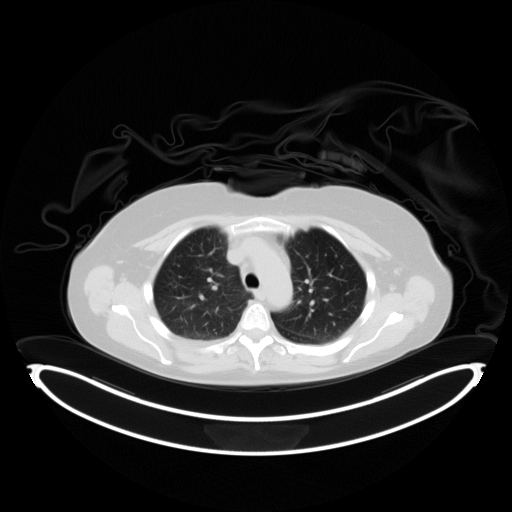
[im 211/211  brain]
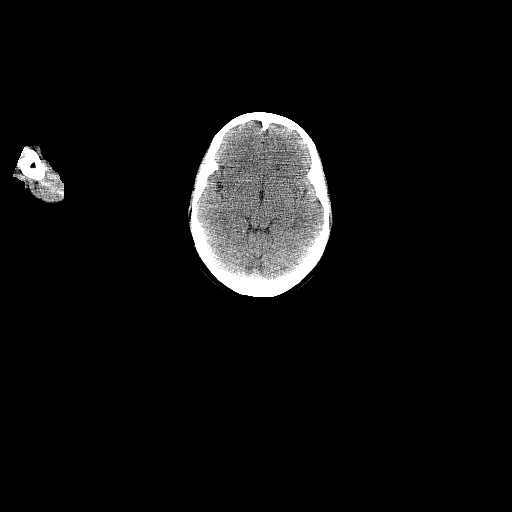

[Series 5: pet sk_thigh nac · axial · 5.0mm · 4.07mm/px · z∈[-1444,-604]mm · 5 of 211 slices shown]
[im 1/211]
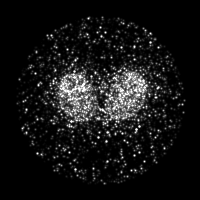
[im 53/211]
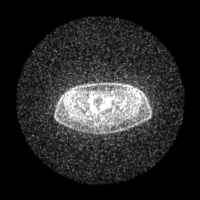
[im 106/211]
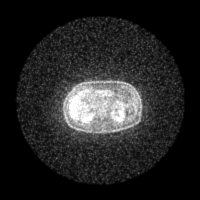
[im 158/211]
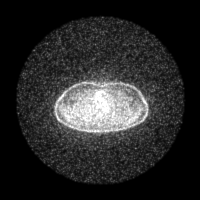
[im 211/211]
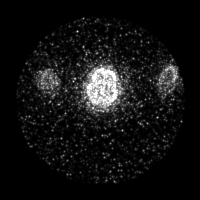

[Series 8: ct sk_thigh 5.0 br59 lung_bone · axial · 5.0mm · 0.63mm/px · z∈[-990,-738]mm · 2 of 64 slices shown]
[im 1/64]
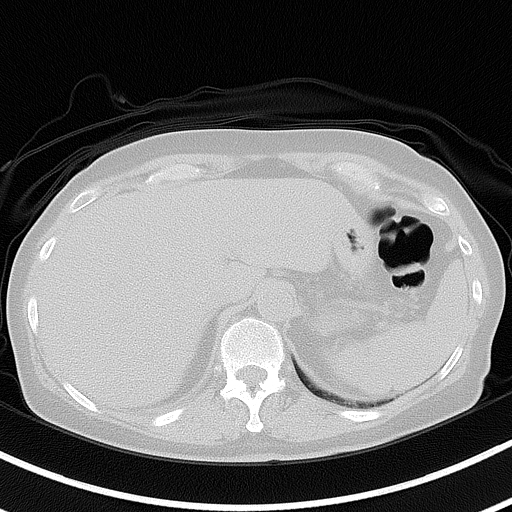
[im 64/64]
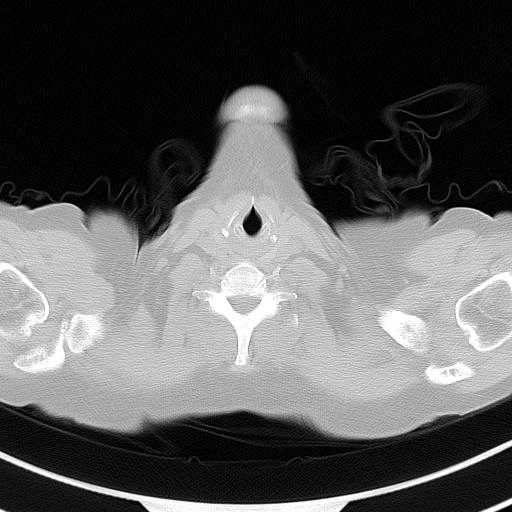

[Series 603: fused cor · 1 of 42 slices shown]
[im 1/42]
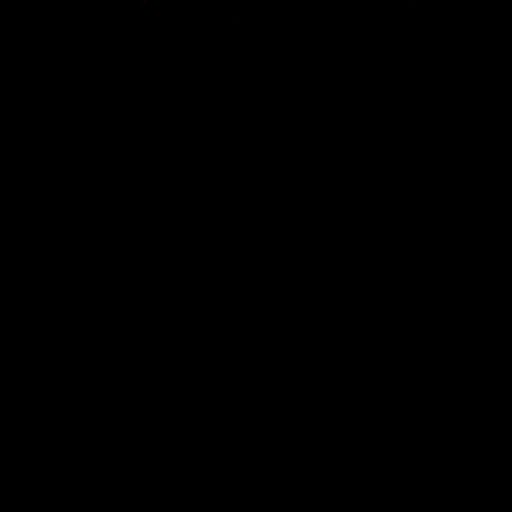

[Series 604: <mip collection> · coronal · 1.74mm/px · 1 of 32 slices shown]
[im 1/32]
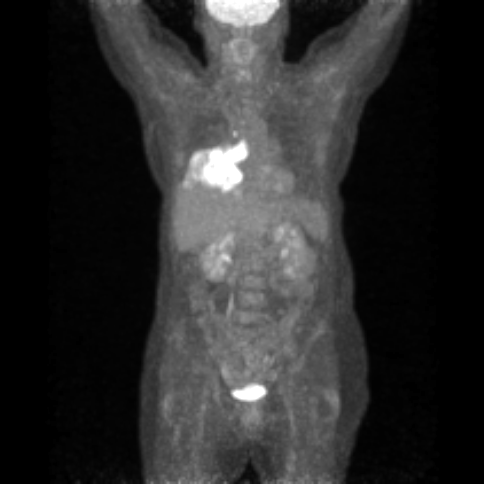

[Series 605: range-ct sk_thigh 5.0 bf37-tra-<alpha range> · 5 of 206 slices shown]
[im 1/206]
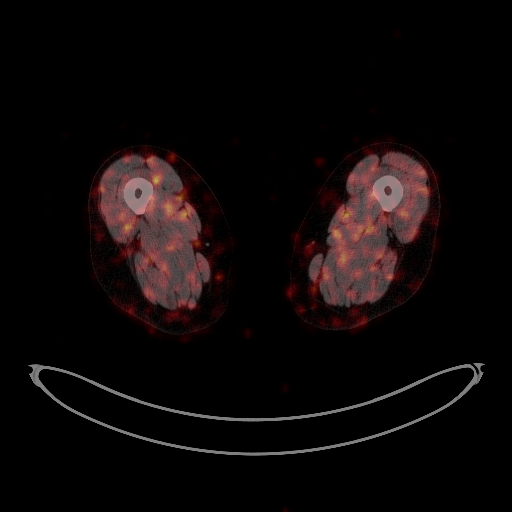
[im 52/206]
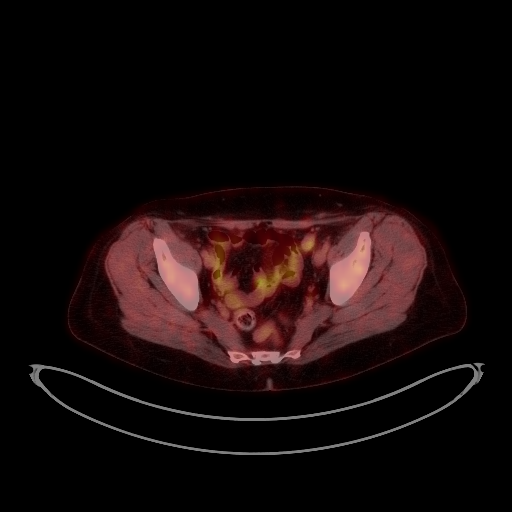
[im 103/206]
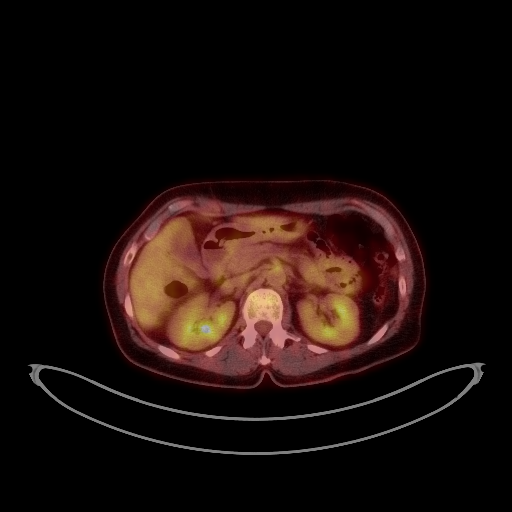
[im 154/206]
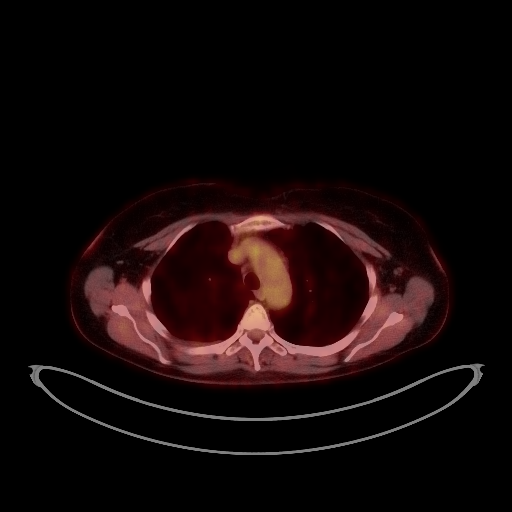
[im 206/206]
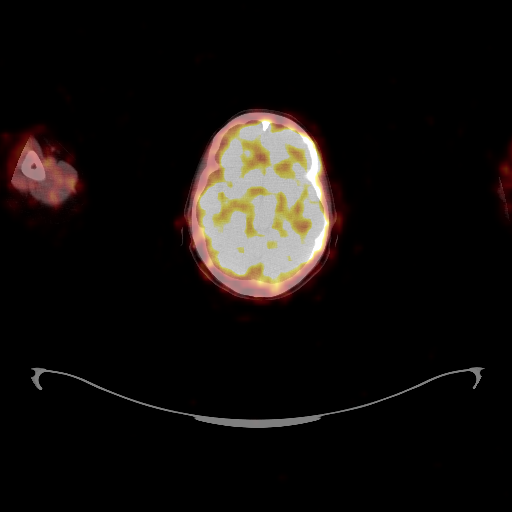

[25 of 25 positions shown; findings below may reference images not displayed]

FINDINGS: Mediastinal blood pool activity: SUV max

Liver activity: SUV max NA

NECK: No hypermetabolic lymph nodes in the neck.

Incidental CT findings: none

CHEST: Large centrally necrotic mass involving the right lower lobe
measures 11.8 cm in maximum dimension with SUV max of 21.55, image
45/8. The mass involves the right hilum with postobstructive changes
including complete atelectasis of the right middle lobe. Further,
the mass appears intimately associated with the right hemidiaphragm.
Cannot exclude involvement of the right hemidiaphragm. FDG avid
subcarinal lymph node measures 1.9 cm and has an SUV max of 15.17,
image 65/4. Low right paratracheal lymph node has a short axis of 8
mm with SUV max of 2.88.

Small left pre-vascular lymph nodes exhibit mild FDG uptake just
above background blood pool activity. Index lymph node measures 5 mm
and has an SUV max of 2.54, image 57/4. Within the posteromedial
right upper lobe there is a FDG avid nodule measuring 1 cm with SUV
max of 5.79, image [DATE]. Also in the right upper lobe is a 1 cm
peribronchovascular nodule with SUV max of 1.0, image [DATE]. No FDG
avid nodules within the left hemithorax.

Incidental CT findings: Aortic atherosclerosis. Coronary artery
calcifications. Trace right pleural fluid posteriorly, image 59/4.

ABDOMEN/PELVIS: No abnormal hypermetabolic activity within the
liver, pancreas, adrenal glands, or spleen. No hypermetabolic lymph
nodes in the abdomen or pelvis.

Incidental CT findings: Aortic atherosclerosis.  No aneurysm.

SKELETON: No focal hypermetabolic activity to suggest skeletal
metastasis.

Incidental CT findings: none
IMPRESSION: 1. Intense FDG uptake is associated with the large, centrally
necrotic right lower lobe lung mass. The mass encases the right
hilum and there is postobstructive atelectasis of the right middle
lobe. Enlarged FDG avid subcarinal lymph node is identified
consistent with metastatic adenopathy. There is equivocal uptake
within subcentimeter right paratracheal and left prevascular lymph
nodes. Separate FDG avid nodule within the posteromedial right upper
lobe is identified. Assuming non-small cell histology imaging
findings are compatible with a [id number] lesion or stage IIIB disease.
2. Cannot exclude involvement of the right hemidiaphragm. If
clinically indicated consider contrast enhanced CT or MRI of the
chest to assess involvement of the right hemidiaphragm.

## 2021-12-02 ENCOUNTER — Ambulatory Visit (AMBULATORY_SURGERY_CENTER): Payer: Medicare Other | Admitting: Gastroenterology

## 2021-12-02 ENCOUNTER — Encounter: Payer: Self-pay | Admitting: Gastroenterology

## 2021-12-02 VITALS — BP 116/73 | HR 83 | Temp 97.5°F | Resp 15 | Ht 62.0 in | Wt 139.8 lb

## 2021-12-02 DIAGNOSIS — Z86010 Personal history of colonic polyps: Secondary | ICD-10-CM | POA: Diagnosis not present

## 2021-12-02 DIAGNOSIS — Z09 Encounter for follow-up examination after completed treatment for conditions other than malignant neoplasm: Secondary | ICD-10-CM

## 2021-12-02 MED ORDER — SODIUM CHLORIDE 0.9 % IV SOLN: 500.0000 mL | Freq: Once | INTRAVENOUS | Status: DC

## 2021-12-02 NOTE — Progress Notes (Signed)
[facility] History and Physical   Primary Care Physician:  Hayden Rasmussen, MD   Reason for Procedure:  History of adenomatous colon polyps  Plan:    Surveillance colonoscopy with possible interventions as needed     HPI: [name] is a very pleasant 66 y.o. female here for surveillance colonoscopy. Denies any nausea, vomiting, abdominal pain, melena or bright red blood per rectum  The risks and benefits as well as alternatives of endoscopic procedure(s) have been discussed and reviewed. All questions answered. The patient agrees to proceed.    Past Medical History:  Diagnosis Date   Anxiety    Asthma    Pt.denies said it was lung ca"   Cancer [facility])    lung   Cataract    bilateral,beginning   Colon polyps    COPD (chronic obstructive pulmonary disease) ([place])    patient denies this dx   Dyspnea    with exertion   Osteoarthritis    Pneumonia    Right lower lobe lung mass     Past Surgical History:  Procedure Laterality Date   ABDOMINAL HYSTERECTOMY N/A 04/28/2015   Procedure: TOTAL HYSTERECTOMY ABDOMINAL;  Surgeon: Nunzio Cobbs, MD;  Location: [facility];  Service: Gynecology;  Laterality: N/A;   BRONCHIAL BIOPSY  06/11/2020   Procedure: BRONCHIAL BIOPSIES;  Surgeon: Collene Gobble, MD;  Location: [place];  Service: Cardiopulmonary;;   BRONCHIAL BRUSHINGS  06/11/2020   Procedure: BRONCHIAL BRUSHINGS;  Surgeon: Collene Gobble, MD;  Location: [place];  Service: Cardiopulmonary;;   BRONCHIAL NEEDLE ASPIRATION BIOPSY  06/11/2020   Procedure: BRONCHIAL NEEDLE ASPIRATION BIOPSIES;  Surgeon: Collene Gobble, MD;  Location: [place];  Service: Cardiopulmonary;;   BRONCHIAL WASHINGS  06/11/2020   Procedure: BRONCHIAL WASHINGS;  Surgeon: Collene Gobble, MD;  Location: [facility] ENDOSCOPY;  Service: Cardiopulmonary;;   BUNIONECTOMY Bilateral    COLONOSCOPY     PNEUMONECTOMY Right    09/01/2020   POLYPECTOMY     rectal nodule removal   05/2012   --benign with Dr. Elmo Putt Thomas([facility])   SALPINGOOPHORECTOMY Bilateral 04/28/2015   Procedure: BILATERAL SALPINGO OOPHORECTOMY WITH PELVIC WASHINGS ;  Surgeon: Nunzio Cobbs, MD;  Location: [facility];  Service: Gynecology;  Laterality: Bilateral;   VIDEO BRONCHOSCOPY WITH ENDOBRONCHIAL ULTRASOUND N/A 06/11/2020   Procedure: VIDEO BRONCHOSCOPY WITH ENDOBRONCHIAL ULTRASOUND;  Surgeon: Collene Gobble, MD;  Location: [place];  Service: Cardiopulmonary;  Laterality: N/A;   WISDOM TOOTH EXTRACTION      Prior to Admission medications   Medication Sig Start Date End Date Taking? Authorizing Provider  ASHWAGANDHA PO Take 1 capsule by mouth daily.   Yes [provider]  Cyanocobalamin (VITAMIN B 12 PO) Take 50 mg by mouth daily.   Yes [provider]  folic acid (FOLVITE) 062 MCG tablet Take 400 mcg by mouth daily.   Yes [provider]  Glucosamine-Chondroitin (OSTEO BI-FLEX REGULAR STRENGTH PO) Take 2 tablets by mouth daily.   Yes [provider]  Multiple Minerals-Vitamins (MULTISOURCE CALCIUM MAG/D) TABS Take 1 tablet by mouth daily.   Yes [provider]  Omega-3 1000 MG CAPS Take by mouth.   Yes [provider]  Omega-3 Fatty Acids (FISH OIL) 1000 MG CAPS Take 1,000 mg by mouth daily.   Yes [provider]  TURMERIC PO Take 1 tablet by mouth daily.   Yes [provider]  albuterol (VENTOLIN HFA) 108 (90 Base) MCG/ACT inhaler Inhale 1-2 puffs  into the lungs every 4 (four) hours as needed for wheezing or shortness of breath. Patient not taking: Reported on 11/10/2021 05/20/20   [provider]  ALPRAZolam Duanne Moron) 0.25 MG tablet  06/03/21   [provider]  fluticasone (FLONASE) 50 MCG/ACT nasal spray Place 1 spray into both nostrils daily as needed for allergies. Patient not taking: Reported on 12/02/2021 04/07/20   [provider]  guaiFENesin (MUCINEX) 600 MG 12 hr tablet Take by  mouth. Patient not taking: Reported on 12/02/2021    [provider]  ibuprofen (ADVIL) 200 MG tablet Take 400 mg by mouth every 6 (six) hours as needed for headache or moderate pain. Patient not taking: Reported on 08/06/2020    [provider]    Current Outpatient Medications  Medication Sig Dispense Refill   ASHWAGANDHA PO Take 1 capsule by mouth daily.     Cyanocobalamin (VITAMIN B 12 PO) Take 50 mg by mouth daily.     folic acid (FOLVITE) 768 MCG tablet Take 400 mcg by mouth daily.     Glucosamine-Chondroitin (OSTEO BI-FLEX REGULAR STRENGTH PO) Take 2 tablets by mouth daily.     Multiple Minerals-Vitamins (MULTISOURCE CALCIUM MAG/D) TABS Take 1 tablet by mouth daily.     Omega-3 1000 MG CAPS Take by mouth.     Omega-3 Fatty Acids (FISH OIL) 1000 MG CAPS Take 1,000 mg by mouth daily.     TURMERIC PO Take 1 tablet by mouth daily.     albuterol (VENTOLIN HFA) 108 (90 Base) MCG/ACT inhaler Inhale 1-2 puffs into the lungs every 4 (four) hours as needed for wheezing or shortness of breath. (Patient not taking: Reported on 11/10/2021)     ALPRAZolam (XANAX) 0.25 MG tablet      fluticasone (FLONASE) 50 MCG/ACT nasal spray Place 1 spray into both nostrils daily as needed for allergies. (Patient not taking: Reported on 12/02/2021)     guaiFENesin (MUCINEX) 600 MG 12 hr tablet Take by mouth. (Patient not taking: Reported on 12/02/2021)     ibuprofen (ADVIL) 200 MG tablet Take 400 mg by mouth every 6 (six) hours as needed for headache or moderate pain. (Patient not taking: Reported on 08/06/2020)     Current Facility-Administered Medications  Medication Dose Route Frequency Provider Last Rate Last Admin   0.9 %  sodium chloride infusion  500 mL Intravenous Once Mauri Pole, MD        Allergies as of 12/02/2021 - Review Complete 12/02/2021  Allergen Reaction Noted   Morphine and related Hives 10/02/2019   Other Hives 07/24/2020   Percocet [oxycodone-acetaminophen] Itching  05/04/2015    Family History  Problem Relation Age of Onset   Hypertension Mother    Thyroid disease Mother        parathyroid Dz   Heart disease Mother    Hyperlipidemia Brother    Colon polyps Brother    Colon cancer Maternal Grandmother    Cancer Maternal Grandmother 61       Colon Ca--Dec age 53 from age related problems   Asthma Maternal Grandmother    Crohn's disease Neg Hx    Esophageal cancer Neg Hx    Stomach cancer Neg Hx    Rectal cancer Neg Hx    Ulcerative colitis Neg Hx     Social History   Socioeconomic History   Marital status: Married    Spouse name: Not on file   Number of children: Not on file   Years of education: Not  on file   Highest education level: Not on file  Occupational History   Not on file  Tobacco Use   Smoking status: Former    Packs/day: 1.00    Years: 20.00    Total pack years: 20.00    Types: Cigarettes    Start date: 42    Quit date: 02/28/1994    Years since quitting: 27.7    Passive exposure: Past   Smokeless tobacco: Never  Vaping Use   Vaping Use: Never used  Substance and Sexual Activity   Alcohol use: Yes    Alcohol/week: 6.0 standard drinks of alcohol    Types: 6 Standard drinks or equivalent per week    Comment: wine   Drug use: No   Sexual activity: Yes    Partners: Male    Birth control/protection: Post-menopausal, Surgical    Comment: TAH/BSO  Other Topics Concern   Not on file  Social History Narrative   Not on file   Social Determinants of Health   Financial Resource Strain: Not on file  Food Insecurity: Not on file  Transportation Needs: Not on file  Physical Activity: Not on file  Stress: Not on file  Social Connections: Not on file  Intimate Partner Violence: Not on file    Review of Systems:  All other review of systems negative except as mentioned in the HPI.  Physical Exam: Vital signs in last 24 hours: Blood Pressure 120/71   Pulse 92   Temperature (Abnormal) 97.5 F (36.4 C) (Skin)    Height 5\' 2"  (1.575 m)   Weight 139 lb 12.8 oz (63.4 kg)   Last Menstrual Period 02/28/2009 (Approximate) Comment: 04/28/15  Oxygen Saturation 99%   Body Mass Index 25.57 kg/m  General:   Alert, NAD Lungs:  Clear .   Heart:  Regular rate and rhythm Abdomen:  Soft, nontender and nondistended. Neuro/Psych:  Alert and cooperative. Normal mood and affect. A and O x 3  Reviewed labs, radiology imaging, old records and pertinent past GI work up  Patient is appropriate for planned procedure(s) and anesthesia in an ambulatory setting   K. Denzil Magnuson , MD [phone]

## 2021-12-02 NOTE — Progress Notes (Signed)
Pt's states no medical or surgical changes since previsit or office visit. VS assessed by D.T 

## 2021-12-02 NOTE — Progress Notes (Signed)
Vss nad trans to pacu °

## 2021-12-02 NOTE — Op Note (Signed)
[place] Patient Name: [name] Procedure Date: 12/02/2021 9:01 AM MRN: [id number] Endoscopist: Mauri Pole , MD Age: 66 Referring MD:  Date of Birth: [birth year] Gender: Female Account #: [id number] Procedure:                Colonoscopy Indications:              High risk colon cancer surveillance: Personal                            history of colonic polyps, High risk colon cancer                            surveillance: Personal history of adenoma less than                            10 mm in size Medicines:                Monitored Anesthesia Care Procedure:                Pre-Anesthesia Assessment:                           - Prior to the procedure, a History and Physical                            was performed, and patient medications and                            allergies were reviewed. The patient's tolerance of                            previous anesthesia was also reviewed. The risks                            and benefits of the procedure and the sedation                            options and risks were discussed with the patient.                            All questions were answered, and informed consent                            was obtained. Prior Anticoagulants: The patient has                            taken no previous anticoagulant or antiplatelet                            agents. ASA Grade Assessment: III - A patient with                            severe systemic disease. After reviewing the risks  and benefits, the patient was deemed in                            satisfactory condition to undergo the procedure.                           After obtaining informed consent, the colonoscope                            was passed under direct vision. Throughout the                            procedure, the patient's blood pressure, pulse, and                            oxygen saturations were monitored  continuously. The                            Olympus PCF-H190DL (#[id number]) Colonoscope was                            introduced through the anus and advanced to the the                            cecum, identified by appendiceal orifice and                            ileocecal valve. The colonoscopy was performed                            without difficulty. The patient tolerated the                            procedure well. The quality of the bowel                            preparation was good. The ileocecal valve,                            appendiceal orifice, and rectum were photographed. Scope In: 9:17:37 AM Scope Out: 9:39:09 AM Scope Withdrawal Time: 0 hours 15 minutes 8 seconds  Total Procedure Duration: 0 hours 21 minutes 32 seconds  Findings:                 The perianal and digital rectal examinations were                            normal.                           A few small-mouthed diverticula were found in the                            sigmoid colon.  Non-bleeding external and internal hemorrhoids were                            found during retroflexion. The hemorrhoids were                            medium-sized. Complications:            No immediate complications. Estimated Blood Loss:     Estimated blood loss was minimal. Impression:               - Diverticulosis in the sigmoid colon.                           - Non-bleeding external and internal hemorrhoids.                           - No specimens collected. Recommendation:           - Patient has a contact number available for                            emergencies. The signs and symptoms of potential                            delayed complications were discussed with the                            patient. Return to normal activities tomorrow.                            Written discharge instructions were provided to the                            patient.                            - Resume previous diet.                           - Continue present medications.                           - Repeat colonoscopy in 10 years for surveillance. Mauri Pole, MD 12/02/2021 9:45:31 AM This report has been signed electronically.

## 2021-12-02 NOTE — Patient Instructions (Signed)
Resume previous medications.    Handouts on findings given to patient.   ( Diverticulosis and hemorrhoids)   Repeat colonoscopy is 10 years  YOU HAD AN ENDOSCOPIC PROCEDURE TODAY AT [place]:   Refer to the procedure report that was given to you for any specific questions about what was found during the examination.  If the procedure report does not answer your questions, please call your gastroenterologist to clarify.  If you requested that your care partner not be given the details of your procedure findings, then the procedure report has been included in a sealed envelope for you to review at your convenience later.  YOU SHOULD EXPECT: Some feelings of bloating in the abdomen. Passage of more gas than usual.  Walking can help get rid of the air that was put into your GI tract during the procedure and reduce the bloating. If you had a lower endoscopy (such as a colonoscopy or flexible sigmoidoscopy) you may notice spotting of blood in your stool or on the toilet paper. If you underwent a bowel prep for your procedure, you may not have a normal bowel movement for a few days.  Please Note:  You might notice some irritation and congestion in your nose or some drainage.  This is from the oxygen used during your procedure.  There is no need for concern and it should clear up in a day or so.  SYMPTOMS TO REPORT IMMEDIATELY:  Following lower endoscopy (colonoscopy or flexible sigmoidoscopy):  Excessive amounts of blood in the stool  Significant tenderness or worsening of abdominal pains  Swelling of the abdomen that is new, acute  Fever of 100F or higher   For urgent or emergent issues, a gastroenterologist can be reached at any hour by calling [phone]. Do not use MyChart messaging for urgent concerns.    DIET:  We do recommend a small meal at first, but then you may proceed to your regular diet.  Drink plenty of fluids but you should avoid alcoholic beverages for 24  hours.  ACTIVITY:  You should plan to take it easy for the rest of today and you should NOT DRIVE or use heavy machinery until tomorrow (because of the sedation medicines used during the test).    FOLLOW UP: Our staff will call the number listed on your records the next business day following your procedure.  We will call around 7:15- 8:00 am to check on you and address any questions or concerns that you may have regarding the information given to you following your procedure. If we do not reach you, we will leave a message.     If any biopsies were taken you will be contacted by phone or by letter within the next 1-3 weeks.  Please call us at [phone] if you have not heard about the biopsies in 3 weeks.    SIGNATURES/CONFIDENTIALITY: You and/or your care partner have signed paperwork which will be entered into your electronic medical record.  These signatures attest to the fact that that the information above on your After Visit Summary has been reviewed and is understood.  Full responsibility of the confidentiality of this discharge information lies with you and/or your care-partner.

## 2021-12-03 ENCOUNTER — Telehealth: Payer: Self-pay | Admitting: *Deleted

## 2021-12-03 NOTE — Telephone Encounter (Signed)
  Follow up Call-     12/02/2021    8:31 AM  Call back number  Post procedure Call Back phone  # [phone]  Permission to leave phone message Yes     Patient questions:  Do you have a fever, pain , or abdominal swelling? No. Pain Score  0 *  Have you tolerated food without any problems? Yes.    Have you been able to return to your normal activities? Yes.    Do you have any questions about your discharge instructions: Diet   No. Medications  No. Follow up visit  No.  Do you have questions or concerns about your Care? No.  Actions: * If pain score is 4 or above: No action needed, pain <4.
